# Patient Record
Sex: Female | Born: 1995 | Race: Black or African American | Hispanic: No | Marital: Single | Smoking: Never smoker
Health system: Southern US, Community
[De-identification: ages and names within clinical notes are randomized; demographics above are authoritative.]

---
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
## 2017-01-13 ENCOUNTER — Emergency Department (HOSPITAL_COMMUNITY): Payer: Medicaid Other

## 2017-01-13 ENCOUNTER — Encounter (HOSPITAL_COMMUNITY): Payer: Self-pay | Admitting: Emergency Medicine

## 2017-01-13 ENCOUNTER — Emergency Department (HOSPITAL_COMMUNITY)
Admission: EM | Admit: 2017-01-13 | Discharge: 2017-01-13 | Disposition: A | Discharge status: Home / Self Care | Payer: Medicaid Other | Attending: Emergency Medicine | Admitting: Emergency Medicine

## 2017-01-13 DIAGNOSIS — Y999 Unspecified external cause status: Secondary | ICD-10-CM | POA: Diagnosis not present

## 2017-01-13 DIAGNOSIS — Y929 Unspecified place or not applicable: Secondary | ICD-10-CM | POA: Insufficient documentation

## 2017-01-13 DIAGNOSIS — Y9302 Activity, running: Secondary | ICD-10-CM | POA: Diagnosis not present

## 2017-01-13 DIAGNOSIS — S99911A Unspecified injury of right ankle, initial encounter: Secondary | ICD-10-CM | POA: Diagnosis present

## 2017-01-13 DIAGNOSIS — X501XXA Overexertion from prolonged static or awkward postures, initial encounter: Secondary | ICD-10-CM | POA: Insufficient documentation

## 2017-01-13 DIAGNOSIS — Z79899 Other long term (current) drug therapy: Secondary | ICD-10-CM | POA: Diagnosis not present

## 2017-01-13 DIAGNOSIS — S93401A Sprain of unspecified ligament of right ankle, initial encounter: Secondary | ICD-10-CM | POA: Diagnosis not present

## 2017-01-13 MED ORDER — NAPROXEN 500 MG PO TABS: 500.0000 mg | ORAL_TABLET | Freq: Two times a day (BID) | ORAL | 0 refills | Status: DC

## 2017-01-13 MED ORDER — OXYCODONE-ACETAMINOPHEN 5-325 MG PO TABS
1.0000 | ORAL_TABLET | Freq: Once | ORAL | Status: AC
  Administered 2017-01-13: 1 via ORAL
  Filled 2017-01-13: qty 1

## 2017-01-13 MED ORDER — NAPROXEN 250 MG PO TABS
500.0000 mg | ORAL_TABLET | Freq: Once | ORAL | Status: AC
  Administered 2017-01-13: 500 mg via ORAL
  Filled 2017-01-13: qty 2

## 2017-01-13 NOTE — ED Triage Notes (Signed)
Pt reports she was running from a dog and twisted her right ankle.  She reports she is unable to apply any weight, and is slightly swollen.

## 2017-01-13 NOTE — ED Provider Notes (Signed)
[facility] Provider Note   CSN: [id number] Arrival date & time: 01/13/17  0022     History   Chief Complaint Chief Complaint  Patient presents with  . Ankle Pain    HPI Kristin [name] is a 21 y.o. female.  The history is provided by the patient. No language interpreter was used.  Ankle Pain   The incident occurred 3 to 5 hours ago. Injury mechanism: rolled ankle. The pain is present in the right ankle. The quality of the pain is described as aching and throbbing. The pain is moderate. The pain has been constant since onset. Associated symptoms include inability to bear weight (secondary to pain). Pertinent negatives include no muscle weakness and no loss of sensation. The symptoms are aggravated by bearing weight. She has tried rest for the symptoms. The treatment provided no relief.    History reviewed. No pertinent past medical history.  There are no active problems to display for this patient.   History reviewed. No pertinent surgical history.  OB History    No data available       Home Medications    Prior to Admission medications   Medication Sig Start Date End Date Taking? Authorizing Provider  cephALEXin (KEFLEX) 500 MG capsule Take 1 capsule (500 mg total) by mouth 2 (two) times daily. X 7 days 07/03/14   Piepenbrink, Victorino Dike, PA-C  metroNIDAZOLE (FLAGYL) 500 MG tablet Take 1 tablet (500 mg total) by mouth 2 (two) times daily. 04/09/14   Reuben Likes, MD  naproxen (NAPROSYN) 500 MG tablet Take 1 tablet (500 mg total) by mouth 2 (two) times daily. 01/13/17   Antony Madura, PA-C  ondansetron (ZOFRAN ODT) 4 MG disintegrating tablet Take 1 tablet (4 mg total) by mouth every 8 (eight) hours as needed for nausea or vomiting. 07/03/14   Piepenbrink, Victorino Dike, PA-C    Family History No family history on file.  Social History Social History  Substance Use Topics  . Smoking status: Never Smoker  . Smokeless tobacco: Not on file  . Alcohol use No      Allergies   Penicillins   Review of Systems Review of Systems Ten systems reviewed and are negative for acute change, except as noted in the HPI.    Physical Exam Updated Vital Signs BP 129/74 (BP Location: Right Arm)   Pulse 81   Temp 98.8 F (37.1 C) (Oral)   Resp 16   Ht 5\' 6"  (1.676 m)   LMP 01/10/2017 (Approximate)   SpO2 100%   Physical Exam  Constitutional: She is oriented to person, place, and time. She appears well-developed and well-nourished. No distress.  Nontoxic and in NAD  HENT:  Head: Normocephalic and atraumatic.  Eyes: Conjunctivae and EOM are normal. No scleral icterus.  Neck: Normal range of motion.  Cardiovascular: Normal rate, regular rhythm and intact distal pulses.   Pulmonary/Chest: Effort normal. No respiratory distress.  Respirations even and unlabored  Musculoskeletal: Normal range of motion. She exhibits tenderness.  Normal range of motion of the right ankle. There is tenderness to palpation to the lateral malleolus of the right ankle. No crepitus or deformity. No effusion.  Neurological: She is alert and oriented to person, place, and time. She exhibits normal muscle tone. Coordination normal.  Patient able to wiggle all toes  Skin: Skin is warm and dry. No rash noted. She is not diaphoretic. No erythema. No pallor.  Psychiatric: She has a normal mood and affect. Her behavior is normal.  Nursing  note and vitals reviewed.    ED Treatments / Results  Labs (all labs ordered are listed, but only abnormal results are displayed) Labs Reviewed - No data to display  EKG  EKG Interpretation None       Radiology Dg Ankle Complete Right  Result Date: 01/13/2017 CLINICAL DATA:  Patient fell tonight. Right lateral ankle pain and swelling. EXAM: RIGHT ANKLE - COMPLETE 3+ VIEW COMPARISON:  None. FINDINGS: There is no evidence of fracture, dislocation, or joint effusion. There is no evidence of arthropathy or other focal bone abnormality.  Soft tissues are unremarkable. IMPRESSION: Negative. Electronically Signed   By: Burman NievesWilliam  Stevens M.D.   On: 01/13/2017 01:10    Procedures Procedures (including critical care time)  Medications Ordered in ED Medications  oxyCODONE-acetaminophen (PERCOCET/ROXICET) 5-325 MG per tablet 1 tablet (1 tablet Oral Given 01/13/17 0143)  naproxen (NAPROSYN) tablet 500 mg (500 mg Oral Given 01/13/17 0143)     Initial Impression / Assessment and Plan / ED Course  I have reviewed the triage vital signs and the nursing notes.  Pertinent labs & imaging results that were available during my care of the patient were reviewed by me and considered in my medical decision making (see chart for details).     Patient presenting for ankle pain after rolling her ankle today. She is neurovascularly intact. Symptoms consistent with ankle sprain. X-ray negative for fracture. Supportive management advised. ASO ankle brace and crutches given. Return precautions discussed and provided. Patient discharged in stable condition with no unaddressed concerns.   Final Clinical Impressions(s) / ED Diagnoses   Final diagnoses:  Sprain of right ankle, unspecified ligament, initial encounter    New Prescriptions Discharge Medication List as of 01/13/2017  1:41 AM    START taking these medications   Details  naproxen (NAPROSYN) 500 MG tablet Take 1 tablet (500 mg total) by mouth 2 (two) times daily., Starting Wed 01/13/2017, Print         Antony MaduraHumes, Shawnette Augello, PA-C 01/13/17 0429    Ward, Layla MawKristen N, DO 01/13/17 [phone]

## 2017-01-13 NOTE — ED Notes (Signed)
Patient verbalized understanding of discharge instructions and denies any further needs or questions at this time. VS stable. Patient ambulatory with steady gait. RN escorted to ED entrance in wheelchair.   

## 2017-10-26 ENCOUNTER — Encounter (HOSPITAL_COMMUNITY): Payer: Self-pay | Admitting: Emergency Medicine

## 2017-10-26 ENCOUNTER — Ambulatory Visit (HOSPITAL_COMMUNITY)
Admission: EM | Admit: 2017-10-26 | Discharge: 2017-10-26 | Disposition: A | Discharge status: Home / Self Care | Payer: Medicaid Other | Attending: Family Medicine | Admitting: Family Medicine

## 2017-10-26 DIAGNOSIS — Z880 Allergy status to penicillin: Secondary | ICD-10-CM | POA: Insufficient documentation

## 2017-10-26 DIAGNOSIS — R1084 Generalized abdominal pain: Secondary | ICD-10-CM

## 2017-10-26 LAB — POCT PREGNANCY, URINE: PREG TEST UR: NEGATIVE

## 2017-10-26 LAB — POCT URINALYSIS DIP (DEVICE)
Bilirubin Urine: NEGATIVE
Glucose, UA: NEGATIVE mg/dL
HGB URINE DIPSTICK: NEGATIVE
Leukocytes, UA: NEGATIVE
NITRITE: NEGATIVE
PROTEIN: NEGATIVE mg/dL
Specific Gravity, Urine: 1.02 (ref 1.005–1.030)
UROBILINOGEN UA: 0.2 mg/dL (ref 0.0–1.0)
pH: 6 (ref 5.0–8.0)

## 2017-10-26 NOTE — Discharge Instructions (Signed)
Pregnancy test is negative, urine showed no signs of infection.  We will send urine off to check for STDs.  If you do not have a bowel movement in the next 2 days, please pick up Miralax and start a capful daily or you may get docusate and take twice daily, then decrease once having regular bowels.  Please return if abdominal pain worsening, you develop other symptoms related to your pain including fever, nausea, vomiting, diarrhea, urinary symptoms, vaginal symptoms, lightheadedness, dizziness.

## 2017-10-26 NOTE — ED Triage Notes (Signed)
PT reports lower abdominal pain that wraps around from lower back. PT reports no vaginal discharge, no pain with intercourse and no dysuria.   PT reports she once had a genital infection that made her back hurt.

## 2017-10-26 NOTE — ED Provider Notes (Signed)
[facility]    CSN: [id number] Arrival date & time: 10/26/17  1713     History   Chief Complaint Chief Complaint  Patient presents with  . Abdominal Pain    HPI Kristin [name] is a 22 y.o. female no significant past medical history presenting today with abdominal pain.  She states that she has had some cramping over the past week.  Pain is located in her lower abdomen and will wrap around to her back.  Cramping is constant although worsens at times.  Denies fever, nausea, vomiting.  Denies any changes in bowels, denies diarrhea, blood in stool.  Last bowel movement was proximally 2 days ago.  She states that this is normal for her to skip a day.  Last menstrual period was around February 14, patient is not on birth control.  She is sexually active.  Denies vaginal discharge, pelvic pain, vaginal irritation or odor.  Denies urinary symptoms of dysuria, increased frequency, incomplete voiding.  Denies any specific aggravating factors, denies associations with food, movement.  Patient is eating and drinking normally without issue or worsening pain.  HPI  History reviewed. No pertinent past medical history.  There are no active problems to display for this patient.   History reviewed. No pertinent surgical history.  OB History    No data available       Home Medications    Prior to Admission medications   Medication Sig Start Date End Date Taking? Authorizing Provider  cephALEXin (KEFLEX) 500 MG capsule Take 1 capsule (500 mg total) by mouth 2 (two) times daily. X 7 days 07/03/14   [name], Kristin DikeJennifer, PA-C  metroNIDAZOLE (FLAGYL) 500 MG tablet Take 1 tablet (500 mg total) by mouth 2 (two) times daily. 04/09/14   Kristin LikesKeller, Kristin C, MD  naproxen (NAPROSYN) 500 MG tablet Take 1 tablet (500 mg total) by mouth 2 (two) times daily. 01/13/17   Antony MaduraHumes, Kelly, PA-C  ondansetron (ZOFRAN ODT) 4 MG disintegrating tablet Take 1 tablet (4 mg total) by mouth every 8 (eight) hours as  needed for nausea or vomiting. 07/03/14   [name], Kristin DikeJennifer, PA-C    Family History No family history on file.  Social History Social History   Tobacco Use  . Smoking status: Never Smoker  Substance Use Topics  . Alcohol use: No  . Drug use: No     Allergies   Penicillins   Review of Systems Review of Systems  Constitutional: Negative for fever.  Respiratory: Negative for shortness of breath.   Cardiovascular: Negative for chest pain.  Gastrointestinal: Positive for abdominal pain. Negative for diarrhea, nausea and vomiting.  Genitourinary: Negative for dysuria, flank pain, genital sores, hematuria, menstrual problem, vaginal bleeding, vaginal discharge and vaginal pain.  Musculoskeletal: Positive for back pain.  Skin: Negative for rash.  Neurological: Negative for dizziness, light-headedness and headaches.     Physical Exam Triage Vital Signs ED Triage Vitals  Enc Vitals Group     BP 10/26/17 1746 121/82     Pulse Rate 10/26/17 1744 78     Resp 10/26/17 1744 16     Temp 10/26/17 1744 98.6 F (37 C)     Temp Source 10/26/17 1744 Oral     SpO2 10/26/17 1744 98 %     Weight 10/26/17 1742 244 lb (110.7 kg)     Height --      Head Circumference --      Peak Flow --      Pain Score 10/26/17 1742  8     Pain Loc --      Pain Edu? --      Excl. in GC? --    No data found.  Updated Vital Signs BP 121/82   Pulse 78   Temp 98.6 F (37 C) (Oral)   Resp 16   Wt 244 lb (110.7 kg)   LMP 10/07/2017   SpO2 98%   BMI 39.38 kg/m   Visual Acuity Right Eye Distance:   Left Eye Distance:   Bilateral Distance:    Right Eye Near:   Left Eye Near:    Bilateral Near:     Physical Exam  Constitutional: She appears well-developed and well-nourished. No distress.  HENT:  Head: Normocephalic and atraumatic.  Eyes: Conjunctivae are normal.  Neck: Neck supple.  Cardiovascular: Normal rate and regular rhythm.  No murmur heard. Pulmonary/Chest: Effort normal and  breath sounds normal. No respiratory distress.  Breathing comfortably at rest, CTA BL  Abdominal: Soft. There is tenderness.  Bowel sounds present throughout all 4 quadrants, mild tenderness to epigastrium that was not reproducible on repalpation.  Nontender to right upper quadrant, negative Murphy's, nontender to right lower quadrant or left lower quadrant.  Musculoskeletal: She exhibits no edema.  Neurological: She is alert.  Skin: Skin is warm and dry.  Psychiatric: She has a normal mood and affect.  Nursing note and vitals reviewed.    UC Treatments / Results  Labs (all labs ordered are listed, but only abnormal results are displayed) Labs Reviewed  URINE CYTOLOGY ANCILLARY ONLY    EKG  EKG Interpretation None       Radiology No results found.  Procedures Procedures (including critical care time)  Medications Ordered in UC Medications - No data to display   Initial Impression / Assessment and Plan / UC Course  I have reviewed the triage vital signs and the nursing notes.  Pertinent labs & imaging results that were available during my care of the patient were reviewed by me and considered in my medical decision making (see chart for details).     Pregnancy test negative, UA unremarkable.  Will send urine cytology to check for STDs.  Will continue to monitor as patient does not have any other associated symptoms or signs concerning for emergent evaluation..  Tylenol or ibuprofen to help with the pain.  Advised that she does not have a bowel movement in the next 2 days to try MiraLAX or docusate.  Advised to return if having worsening abdominal pain or developing any other associated symptoms.  Final Clinical Impressions(s) / UC Diagnoses   Final diagnoses:  Generalized abdominal pain    ED Discharge Orders    None       Controlled Substance Prescriptions Kristin [name] Controlled Substance Registry consulted? Not Applicable   Lew Dawes, [place] 10/26/17  1859

## 2017-10-27 LAB — URINE CYTOLOGY ANCILLARY ONLY
Chlamydia: NEGATIVE
Neisseria Gonorrhea: NEGATIVE
TRICH (WINDOWPATH): NEGATIVE

## 2017-10-28 LAB — URINE CYTOLOGY ANCILLARY ONLY: Candida vaginitis: NEGATIVE

## 2019-05-20 ENCOUNTER — Encounter (HOSPITAL_COMMUNITY): Payer: Self-pay | Admitting: Emergency Medicine

## 2019-05-20 ENCOUNTER — Emergency Department (HOSPITAL_COMMUNITY): Payer: Self-pay

## 2019-05-20 ENCOUNTER — Emergency Department (HOSPITAL_COMMUNITY)
Admission: EM | Admit: 2019-05-20 | Discharge: 2019-05-21 | Disposition: A | Discharge status: Home / Self Care | Payer: Self-pay | Attending: Emergency Medicine | Admitting: Emergency Medicine

## 2019-05-20 ENCOUNTER — Other Ambulatory Visit: Payer: Self-pay

## 2019-05-20 DIAGNOSIS — Z79899 Other long term (current) drug therapy: Secondary | ICD-10-CM | POA: Insufficient documentation

## 2019-05-20 DIAGNOSIS — B9689 Other specified bacterial agents as the cause of diseases classified elsewhere: Secondary | ICD-10-CM | POA: Insufficient documentation

## 2019-05-20 DIAGNOSIS — R52 Pain, unspecified: Secondary | ICD-10-CM

## 2019-05-20 DIAGNOSIS — N760 Acute vaginitis: Secondary | ICD-10-CM | POA: Insufficient documentation

## 2019-05-20 DIAGNOSIS — R102 Pelvic and perineal pain: Secondary | ICD-10-CM | POA: Insufficient documentation

## 2019-05-20 LAB — URINALYSIS, ROUTINE W REFLEX MICROSCOPIC
Bilirubin Urine: NEGATIVE
Glucose, UA: NEGATIVE mg/dL
Hgb urine dipstick: NEGATIVE
Ketones, ur: 5 mg/dL — AB
Nitrite: NEGATIVE
Protein, ur: NEGATIVE mg/dL
Specific Gravity, Urine: 1.019 (ref 1.005–1.030)
pH: 7 (ref 5.0–8.0)

## 2019-05-20 LAB — CBC WITH DIFFERENTIAL/PLATELET
Abs Immature Granulocytes: 0.02 10*3/uL (ref 0.00–0.07)
Basophils Absolute: 0 10*3/uL (ref 0.0–0.1)
Basophils Relative: 0 %
Eosinophils Absolute: 0.2 10*3/uL (ref 0.0–0.5)
Eosinophils Relative: 2 %
HCT: 37.3 % (ref 36.0–46.0)
Hemoglobin: 12 g/dL (ref 12.0–15.0)
Immature Granulocytes: 0 %
Lymphocytes Relative: 33 %
Lymphs Abs: 3.7 10*3/uL (ref 0.7–4.0)
MCH: 29.3 pg (ref 26.0–34.0)
MCHC: 32.2 g/dL (ref 30.0–36.0)
MCV: 91.2 fL (ref 80.0–100.0)
Monocytes Absolute: 0.9 10*3/uL (ref 0.1–1.0)
Monocytes Relative: 7 %
Neutro Abs: 6.6 10*3/uL (ref 1.7–7.7)
Neutrophils Relative %: 58 %
Platelets: 343 10*3/uL (ref 150–400)
RBC: 4.09 MIL/uL (ref 3.87–5.11)
RDW: 13.5 % (ref 11.5–15.5)
WBC: 11.5 10*3/uL — ABNORMAL HIGH (ref 4.0–10.5)
nRBC: 0 % (ref 0.0–0.2)

## 2019-05-20 LAB — COMPREHENSIVE METABOLIC PANEL
ALT: 16 U/L (ref 0–44)
AST: 19 U/L (ref 15–41)
Albumin: 4.1 g/dL (ref 3.5–5.0)
Alkaline Phosphatase: 53 U/L (ref 38–126)
Anion gap: 10 (ref 5–15)
BUN: 9 mg/dL (ref 6–20)
CO2: 18 mmol/L — ABNORMAL LOW (ref 22–32)
Calcium: 9 mg/dL (ref 8.9–10.3)
Chloride: 110 mmol/L (ref 98–111)
Creatinine, Ser: 0.59 mg/dL (ref 0.44–1.00)
GFR calc Af Amer: >60 mL/min (ref >60)
GFR calc non Af Amer: >60 mL/min (ref >60)
Glucose, Bld: 92 mg/dL (ref 70–99)
Potassium: 3.8 mmol/L (ref 3.5–5.1)
Sodium: 138 mmol/L (ref 135–145)
Total Bilirubin: 0.6 mg/dL (ref 0.3–1.2)
Total Protein: 7.4 g/dL (ref 6.5–8.1)

## 2019-05-20 LAB — WET PREP, GENITAL
Sperm: NONE SEEN
Trich, Wet Prep: NONE SEEN
Yeast Wet Prep HPF POC: NONE SEEN

## 2019-05-20 LAB — I-STAT BETA HCG BLOOD, ED (MC, WL, AP ONLY): I-stat hCG, quantitative: <5 m[IU]/mL (ref <5)

## 2019-05-20 NOTE — ED Triage Notes (Signed)
Pt c/o intermittent lower abdominal pelvic pain. Pt denies pain with urination. Denies vomiting, denies diarrhea.

## 2019-05-21 LAB — HIV ANTIBODY (ROUTINE TESTING W REFLEX): HIV Screen 4th Generation wRfx: NONREACTIVE

## 2019-05-21 MED ORDER — METRONIDAZOLE 500 MG PO TABS: 500.0000 mg | ORAL_TABLET | Freq: Two times a day (BID) | ORAL | 0 refills | Status: DC

## 2019-05-21 NOTE — Discharge Instructions (Signed)
Take antibiotics as directed, make sure you complete entire course, do not drink alcohol while taking this medication.  Continue to use Motrin and Tylenol as needed for pain.  Monitor your symptoms if you have persisting or worsening abdominal pain, fever, vomiting or any other new or concerning symptoms return for reevaluation.  You have STD testing pending and will be called by phone with any positive results, if positive please follow-up with your OB/GYN or at the health department for treatment and notify any partners so they can be tested and treated as well.

## 2019-05-21 NOTE — ED Provider Notes (Addendum)
[facility]-EMERGENCY DEPT Provider Note   CSN: [id number] Arrival date & time: 05/20/19  1500     History   Chief Complaint Chief Complaint  Patient presents with   Pelvic Pain    HPI Kristin [name] is a 23 y.o. female.     Kristin [name] is a 23 y.o. female who is otherwise healthy, presents to the emergency department for evaluation of 1 week of intermittent lower abdominal and pelvic pain.  Patient reports pain seems to come and go, currently is mild, does not localize to one side.  She denies any associated nausea or vomiting.  No abnormal bowel movements.  She denies dysuria or urinary frequency.  No flank pain.  She has noted some gray-white discharge.  Patient reports she is sexually active but does not always use protection, currently has one partner.  She has not taken any medications to treat the symptoms.  Reports she got tired of having the pain occur intermittently and so decided to come in, it did not get significantly worse today.  Denies any other associated symptoms.     History reviewed. No pertinent past medical history.  There are no active problems to display for this patient.   History reviewed. No pertinent surgical history.   OB History   No obstetric history on file.      Home Medications    Prior to Admission medications   Medication Sig Start Date End Date Taking? Authorizing Provider  ibuprofen (ADVIL) 200 MG tablet Take 800 mg by mouth every 6 (six) hours as needed for moderate pain.   Yes [provider]  cephALEXin (KEFLEX) 500 MG capsule Take 1 capsule (500 mg total) by mouth 2 (two) times daily. X 7 days Patient not taking: Reported on 05/20/2019 07/03/14   Piepenbrink, Victorino Dike, PA-C  metroNIDAZOLE (FLAGYL) 500 MG tablet Take 1 tablet (500 mg total) by mouth 2 (two) times daily. One po bid x 7 days 05/21/19   Dartha Lodge, PA-C  naproxen (NAPROSYN) 500 MG tablet Take 1 tablet (500 mg total) by mouth 2  (two) times daily. Patient not taking: Reported on 05/20/2019 01/13/17   Antony Madura, PA-C  ondansetron (ZOFRAN ODT) 4 MG disintegrating tablet Take 1 tablet (4 mg total) by mouth every 8 (eight) hours as needed for nausea or vomiting. Patient not taking: Reported on 05/20/2019 07/03/14   Francee Piccolo, PA-C    Family History No family history on file.  Social History Social History   Tobacco Use   Smoking status: Never Smoker   Smokeless tobacco: Never Used  Substance Use Topics   Alcohol use: No   Drug use: No     Allergies   Penicillins   Review of Systems Review of Systems  Constitutional: Negative for chills and fever.  HENT: Negative.   Respiratory: Negative for cough and shortness of breath.   Cardiovascular: Negative for chest pain.  Gastrointestinal: Positive for abdominal pain. Negative for diarrhea, nausea and vomiting.  Genitourinary: Positive for pelvic pain and vaginal discharge. Negative for dysuria, flank pain, frequency, vaginal bleeding and vaginal pain.  Musculoskeletal: Negative for arthralgias and myalgias.  Skin: Negative for color change and rash.  Neurological: Negative for dizziness, syncope and light-headedness.     Physical Exam Updated Vital Signs BP 128/77    Pulse 72    Temp 98.5 F (36.9 C) (Oral)    Resp 18    Ht 5\' 6"  (1.676 m)    Wt 113.4 kg  LMP 04/29/2019    SpO2 100%    BMI 40.35 kg/m   Physical Exam Vitals signs and nursing note reviewed.  Constitutional:      General: She is not in acute distress.    Appearance: Normal appearance. She is well-developed and normal weight. She is not ill-appearing or diaphoretic.  HENT:     Head: Normocephalic and atraumatic.     Mouth/Throat:     Mouth: Mucous membranes are moist.     Pharynx: Oropharynx is clear.  Eyes:     General:        Right eye: No discharge.        Left eye: No discharge.     Pupils: Pupils are equal, round, and reactive to light.  Neck:      Musculoskeletal: Neck supple.  Cardiovascular:     Rate and Rhythm: Normal rate and regular rhythm.     Heart sounds: Normal heart sounds. No murmur. No friction rub. No gallop.   Pulmonary:     Effort: Pulmonary effort is normal. No respiratory distress.     Breath sounds: Normal breath sounds. No wheezing or rales.     Comments: Respirations equal and unlabored, patient able to speak in full sentences, lungs clear to auscultation bilaterally Abdominal:     General: Bowel sounds are normal. There is no distension.     Palpations: Abdomen is soft. There is no mass.     Tenderness: There is abdominal tenderness. There is no guarding.     Comments: Abdomen soft, non-distended, bowel sounds present across the low abdomen into the pelvis, does not localize to one side, no guarding or peritoneal signs.  No CVA tenderness bilaterally.  Genitourinary:    Comments: Chaperone present during pelvic exam No external genital lesions noted Speculum exam reveals moderate amount of white-grey discharge Bimaual exam with some left adnexal tenderness without palpable masses, no CMT Musculoskeletal:        General: No deformity.  Skin:    General: Skin is warm and dry.     Capillary Refill: Capillary refill takes less than 2 seconds.  Neurological:     Mental Status: She is alert.     Coordination: Coordination normal.     Comments: Speech is clear, able to follow commands Moves extremities without ataxia, coordination intact  Psychiatric:        Mood and Affect: Mood normal.        Behavior: Behavior normal.      ED Treatments / Results  Labs (all labs ordered are listed, but only abnormal results are displayed) Labs Reviewed  WET PREP, GENITAL - Abnormal; Notable for the following components:      Result Value   Clue Cells Wet Prep HPF POC PRESENT (*)    WBC, Wet Prep HPF POC FEW (*)    All other components within normal limits  URINALYSIS, ROUTINE W REFLEX MICROSCOPIC - Abnormal; Notable  for the following components:   Ketones, ur 5 (*)    Leukocytes,Ua SMALL (*)    Bacteria, UA RARE (*)    All other components within normal limits  COMPREHENSIVE METABOLIC PANEL - Abnormal; Notable for the following components:   CO2 18 (*)    All other components within normal limits  CBC WITH DIFFERENTIAL/PLATELET - Abnormal; Notable for the following components:   WBC 11.5 (*)    All other components within normal limits  RPR  HIV ANTIBODY (ROUTINE TESTING W REFLEX)  I-STAT BETA HCG BLOOD, ED (  [facility], [facility], [facility] ONLY)  GC/CHLAMYDIA PROBE AMP ([place]) NOT AT [facility]    EKG None  Radiology [place] Art/ven Flow Abd Pelv Doppler  Result Date: 05/21/2019 CLINICAL DATA:  One week pelvic pain EXAM: TRANSABDOMINAL ULTRASOUND OF PELVIS DOPPLER ULTRASOUND OF OVARIES TECHNIQUE: Transabdominal ultrasound examination of the pelvis was performed including evaluation of the uterus, ovaries, adnexal regions, and pelvic cul-de-sac. Color and duplex Doppler ultrasound was utilized to evaluate blood flow to the ovaries. COMPARISON:  None. FINDINGS: Uterus Measurements: 8.1 x 3.4 x 2.8 cm = volume: 41 mL. No fibroids or other mass visualized. Endometrium Thickness: 9 mm, normal.  No focal abnormality visualized. Right ovary Measurements: 4.6 x 4.1 x 4.3 cm = volume: 42.4 mL. Normal appearance of the ovarian stroma. Hypoattenuating dominant follicle versus corpus luteum cyst measuring 2.5 x 2.2 x 2.1 cm. Left ovary Measurements: 4.8 x 3.4 x 4.1 cm = volume: 34.4 mL. Normal appearance/no adnexal mass. Pulsed Doppler evaluation demonstrates normal low-resistance arterial and venous waveforms in both ovaries. Other: Sonographer noted trace anechoic free fluid in the deep pelvis IMPRESSION: Unremarkable pelvic ultrasound and Doppler evaluation with dominant follicle in the right ovary. Electronically Signed   By: Kreg Shropshire M.D.   On: 05/21/2019 00:38    Procedures Procedures (including critical care  time)  Medications Ordered in ED Medications - No data to display   Initial Impression / Assessment and Plan / ED Course  I have reviewed the triage vital signs and the nursing notes.  Pertinent labs & imaging results that were available during my care of the patient were reviewed by me and considered in my medical decision making (see chart for details).  23 year old female presents with a week of intermittent lower abdominal and pelvic pain.  It is currently mild, not associated with nausea, vomiting.  She denies dysuria or urinary frequency, no flank pain.  She has noted some white-gray discharge, is sexually active and does not always use protection.  Vitals are normal and patient is well-appearing, on exam she has some mild tenderness across the lower abdomen and pelvic region.  Pelvic exam ordered amount of white-gray discharge, no cervical motion tenderness but she does have some left-sided adnexal tenderness.  STD testing collected, will also check basic labs, and pelvic ultrasound to look for any TOA, ovarian cyst or ovarian torsion, will also assess for fibroids.  Labs show mild leukocytosis of 11, no significant electrolyte derangements, and liver function.  Urinalysis without signs of infection, clue cells present and a few WBCs, STD testing pending.  Ultrasound shows a dominant follicle in the right ovary, no evidence of ovarian cysts, torsion, adnexal masses or fibroids.  Given dominant follicle present, patient may be experiencing mittelschmerz pain from her cycle.  On reevaluation she has minimal if any tenderness over the lower abdomen.  I think that CT imaging would be low yield at this point and I have low suspicion for diverticulitis, appendicitis or other acute intra-abdominal pathology.  Will be treated with Flagyl for BV, discussed precautions with this medication.  I have discussed strict return precautions with the patient and close follow-up with PCP and OB/GYN.  Patient is  in agreement and would like to be discharged home rather than proceeding with further imaging or work-up at this time.  Encourage Motrin and Tylenol, as well as warm compresses, patient will return for any new or worsening symptoms.  Discharged home in good condition.  Final Clinical Impressions(s) / ED Diagnoses   Final diagnoses:  Pelvic pain in female  BV (bacterial vaginosis)    ED Discharge Orders         Ordered    metroNIDAZOLE (FLAGYL) 500 MG tablet  2 times daily     05/21/19 0058           Jacqlyn Larsen, PA-C 05/21/19 1545    Jacqlyn Larsen, PA-C 05/30/19 1217    Drenda Freeze, MD 05/30/19 [phone]

## 2019-05-23 LAB — RPR: RPR Ser Ql: NONREACTIVE — AB

## 2019-05-23 LAB — CERVICOVAGINAL ANCILLARY ONLY
Chlamydia: POSITIVE — AB
Neisseria Gonorrhea: POSITIVE — AB

## 2020-05-26 IMAGING — US US TRANSVAGINAL NON-OB
1 series · 14 of 25 positions shown · non-contrast
Comparison: None.

CLINICAL DATA: One week pelvic pain

EXAM:
TRANSABDOMINAL ULTRASOUND OF PELVIS
DOPPLER ULTRASOUND OF OVARIES
TECHNIQUE: Transabdominal ultrasound examination of the pelvis was performed
including evaluation of the uterus, ovaries, adnexal regions, and
pelvic cul-de-sac.
Color and duplex Doppler ultrasound was utilized to evaluate blood
flow to the ovaries.

[Series 1: us transvaginal non-ob · 14 of 59 slices shown]
[im 1/59]
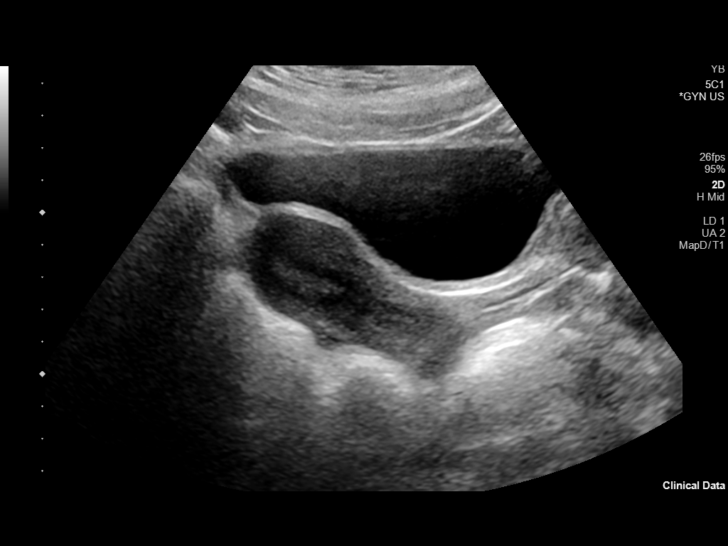
[im 5/59]
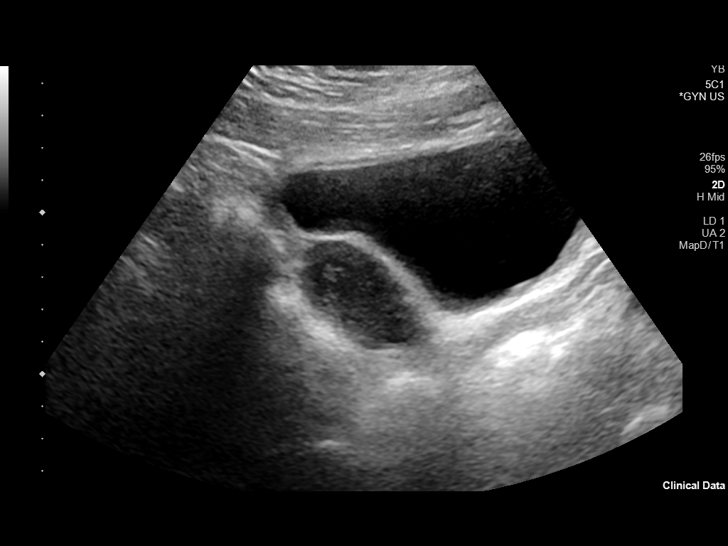
[im 10/59]
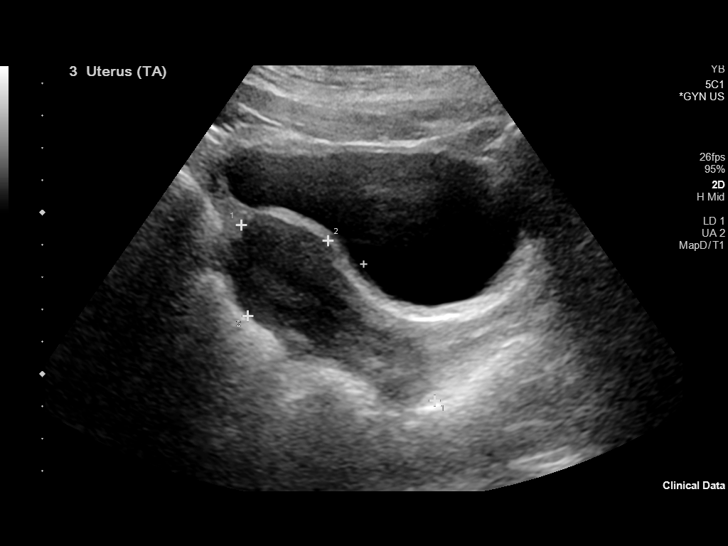
[im 15/59]
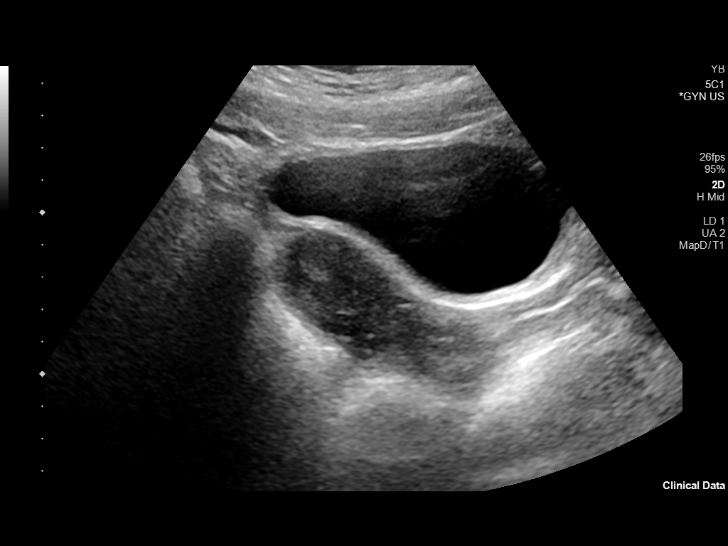
[im 20/59]
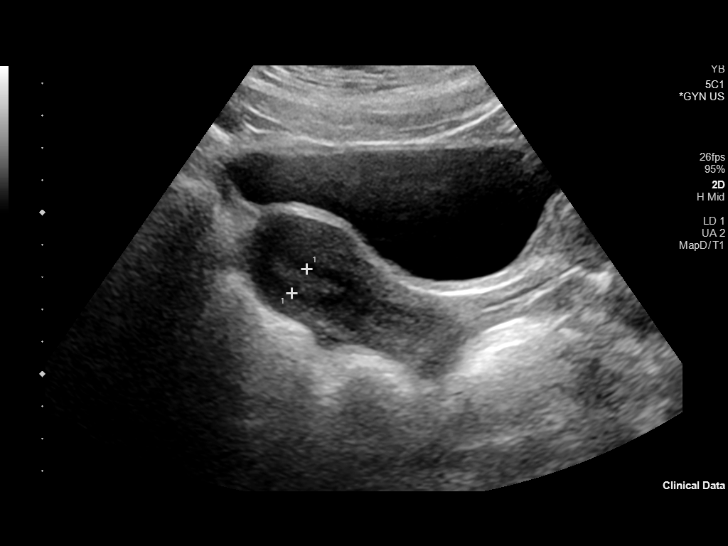
[im 22/59]
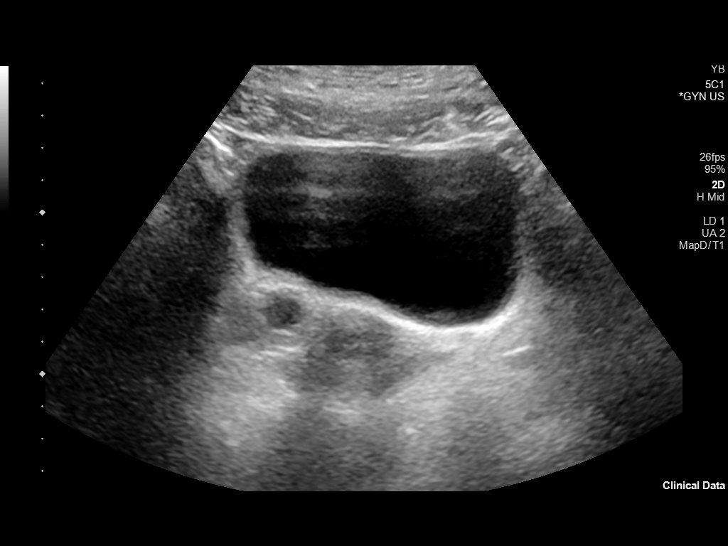
[im 27/59]
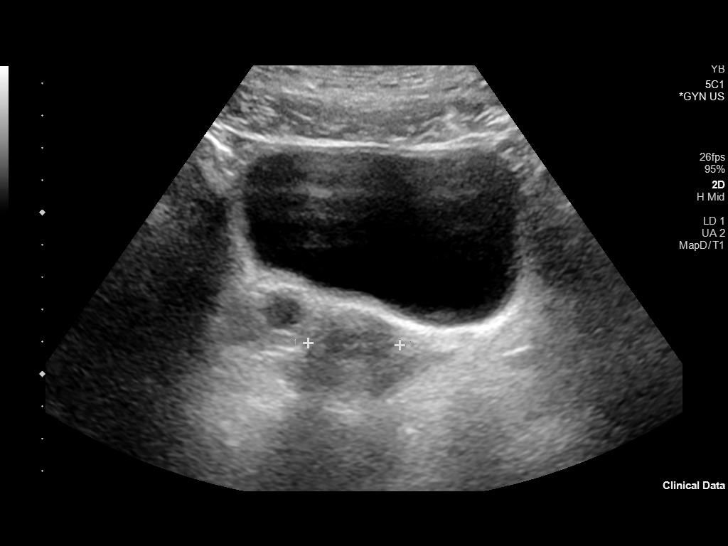
[im 32/59]
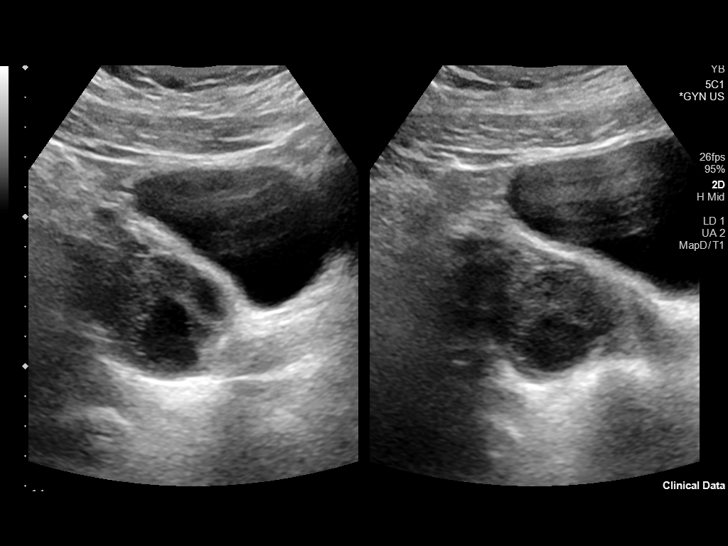
[im 37/59]
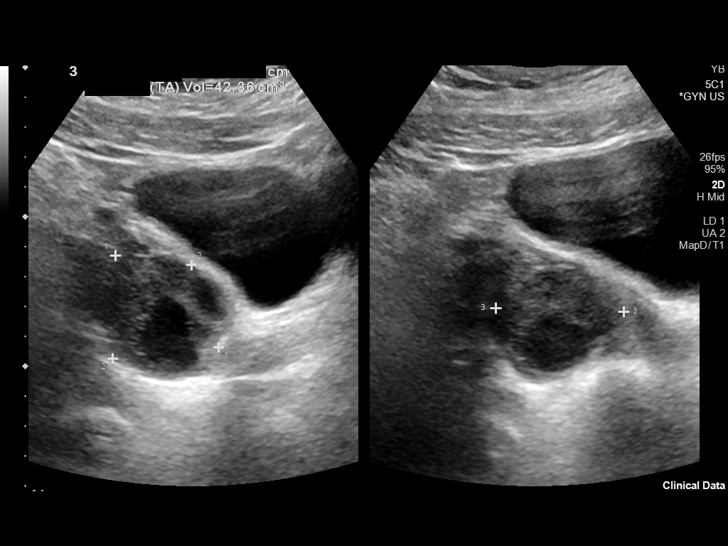
[im 39/59]
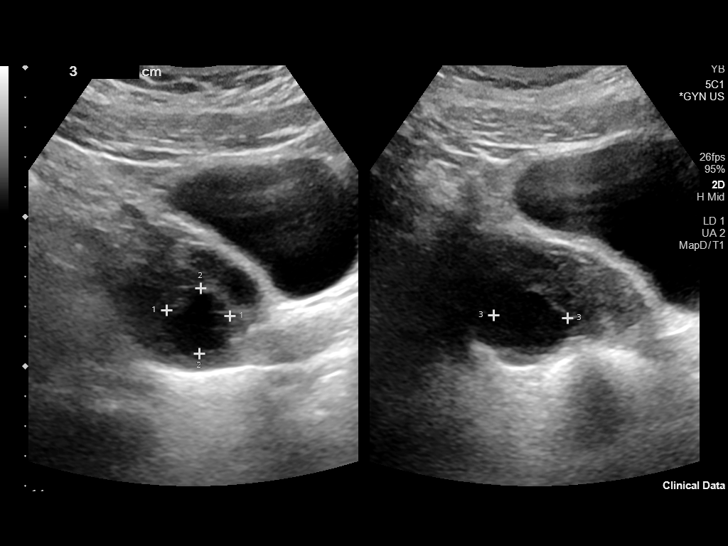
[im 44/59]
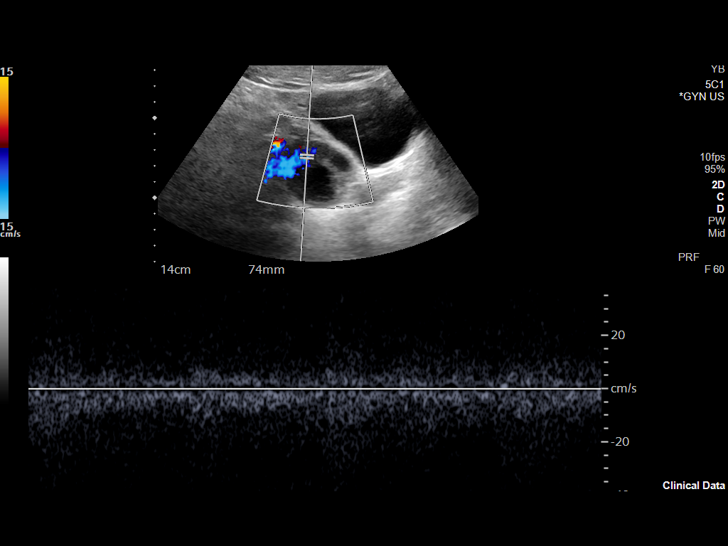
[im 49/59]
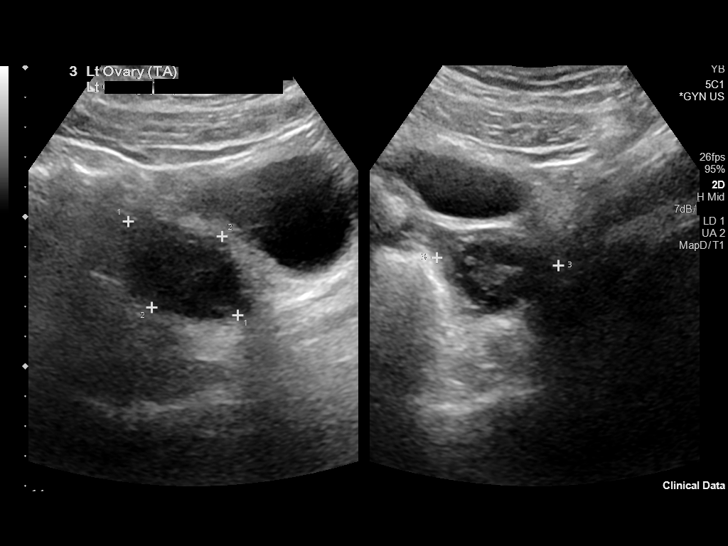
[im 54/59]
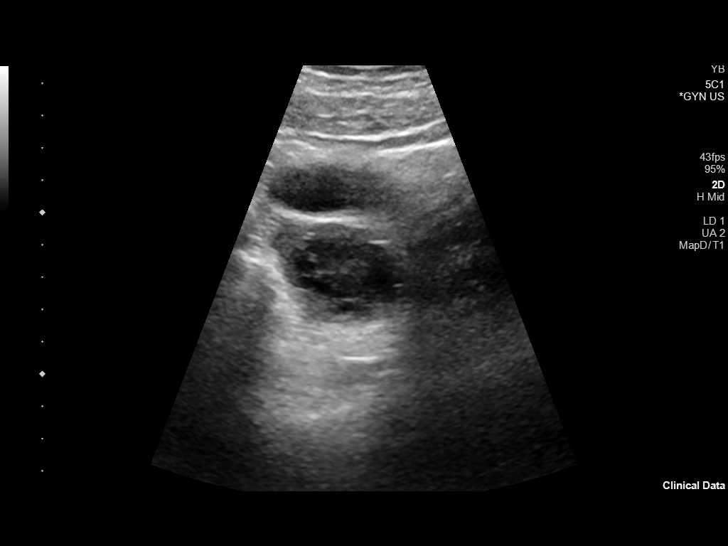
[im 59/59]
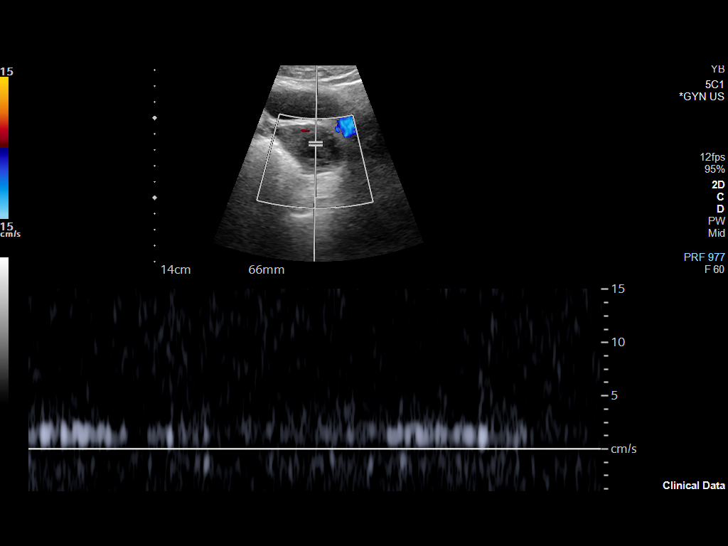

[14 of 25 positions shown; findings below may reference images not displayed]

FINDINGS: Uterus

Measurements: 8.1 x 3.4 x 2.8 cm = volume: 41 mL. No fibroids or
other mass visualized.

Endometrium

Thickness: 9 mm, normal.  No focal abnormality visualized.

Right ovary

Measurements: 4.6 x 4.1 x 4.3 cm = volume: 42.4 mL. Normal
appearance of the ovarian stroma. Hypoattenuating dominant follicle
versus corpus luteum cyst measuring 2.5 x 2.2 x 2.1 cm.

Left ovary

Measurements: 4.8 x 3.4 x 4.1 cm = volume: 34.4 mL. Normal
appearance/no adnexal mass.

Pulsed Doppler evaluation demonstrates normal low-resistance
arterial and venous waveforms in both ovaries.

Other: Sonographer noted trace anechoic free fluid in the deep
pelvis
IMPRESSION: Unremarkable pelvic ultrasound and Doppler evaluation with dominant
follicle in the right ovary.

## 2020-05-26 IMAGING — US US ART/VEN ABD/PELV/SCROTUM DOPPLER LTD
1 series · 14 of 25 positions shown · non-contrast
Comparison: None.

CLINICAL DATA: One week pelvic pain

EXAM:
TRANSABDOMINAL ULTRASOUND OF PELVIS
DOPPLER ULTRASOUND OF OVARIES
TECHNIQUE: Transabdominal ultrasound examination of the pelvis was performed
including evaluation of the uterus, ovaries, adnexal regions, and
pelvic cul-de-sac.
Color and duplex Doppler ultrasound was utilized to evaluate blood
flow to the ovaries.

[Series 1: us art/ven abd/pelv/scrotum doppler ltd · 14 of 59 slices shown]
[im 1/59]
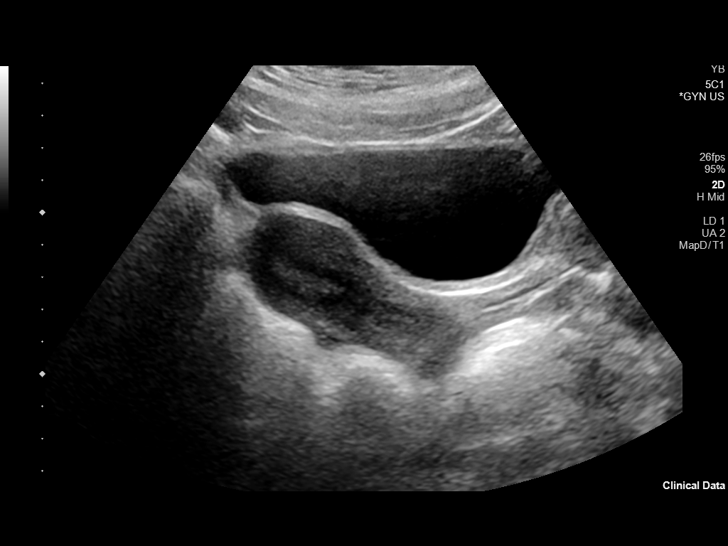
[im 5/59]
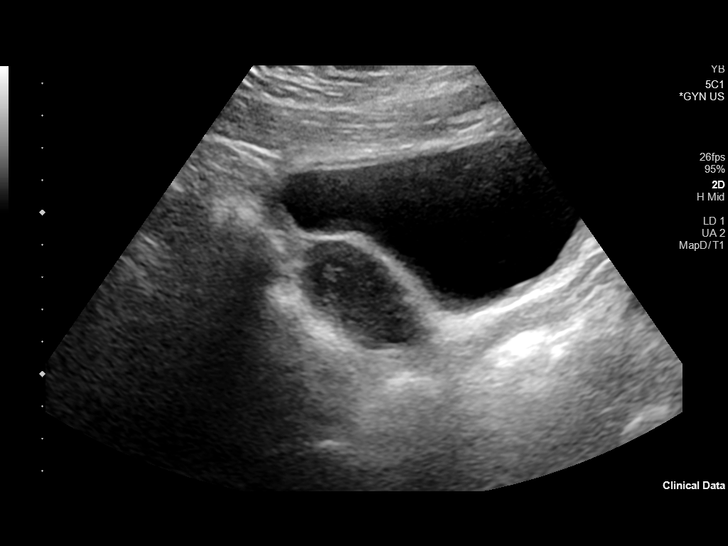
[im 10/59]
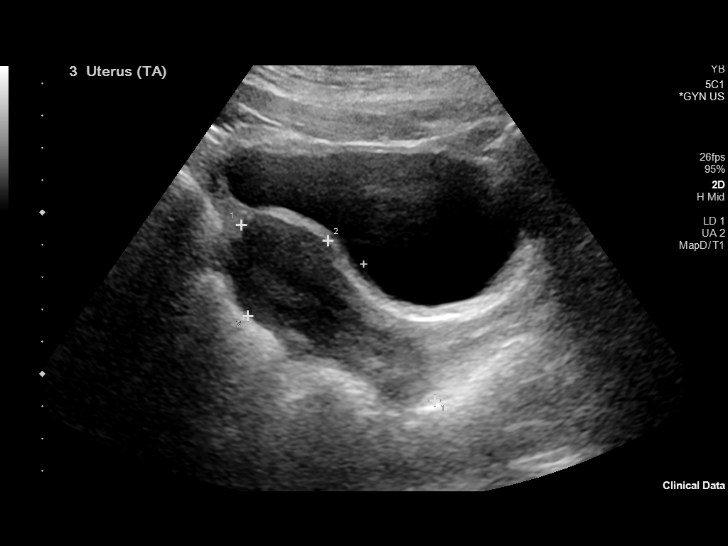
[im 15/59]
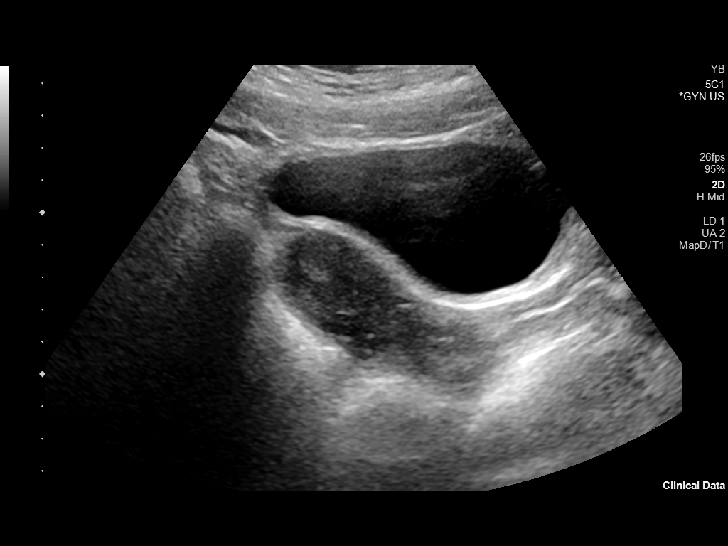
[im 20/59]
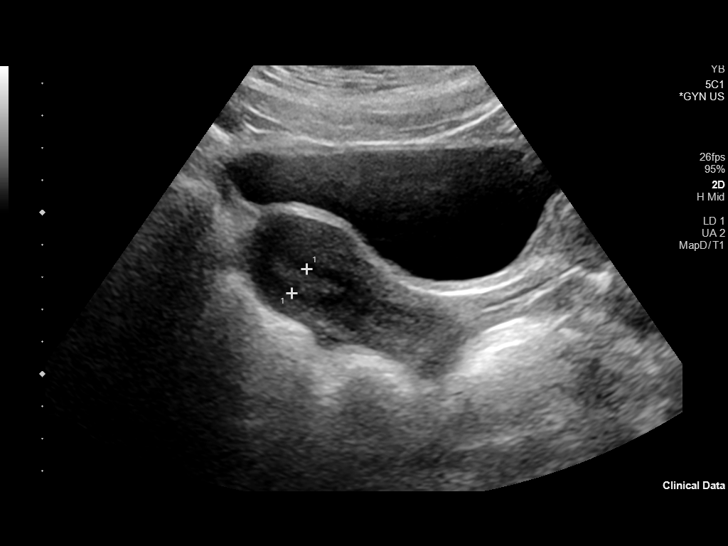
[im 22/59]
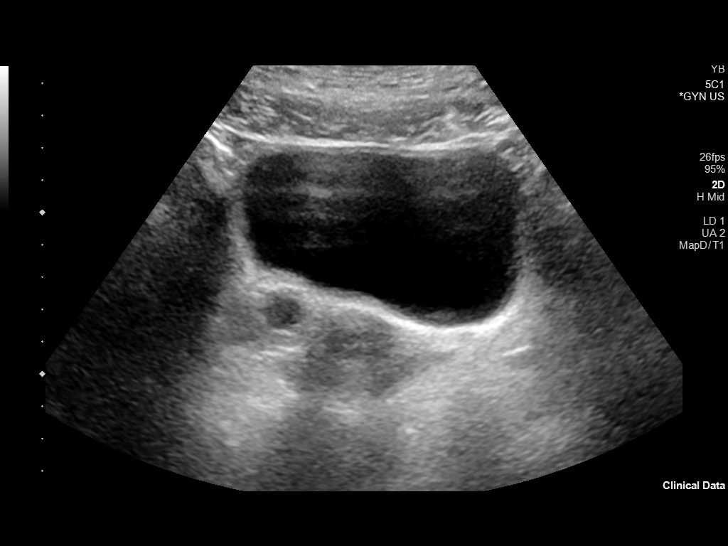
[im 27/59]
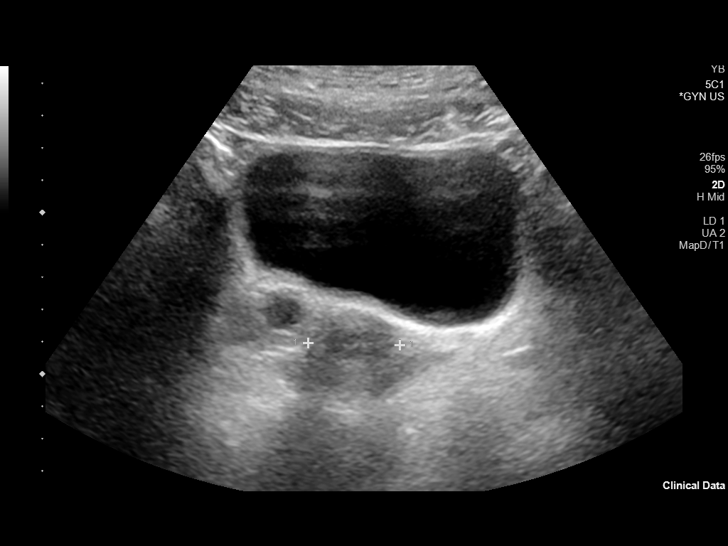
[im 32/59]
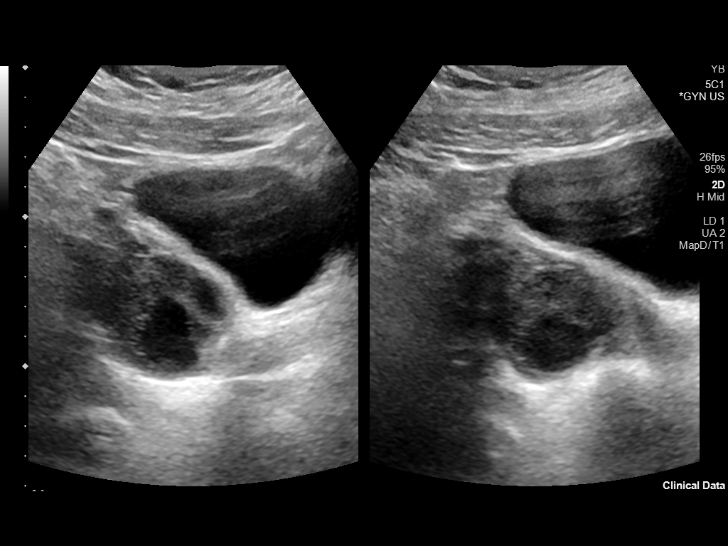
[im 37/59]
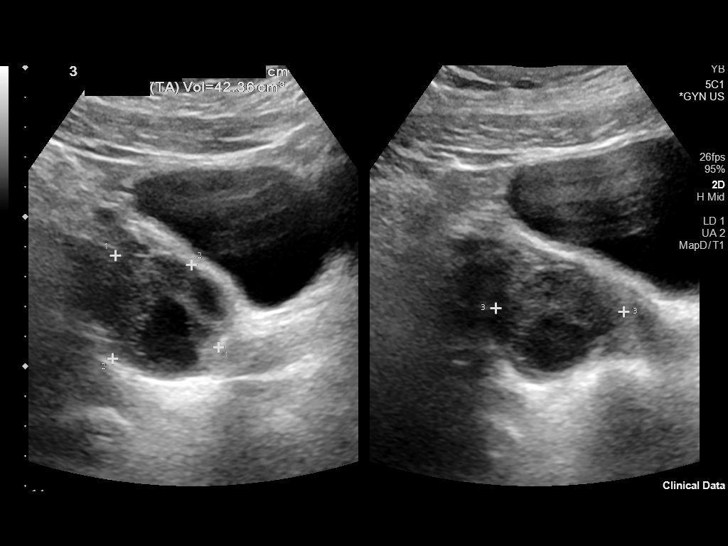
[im 39/59]
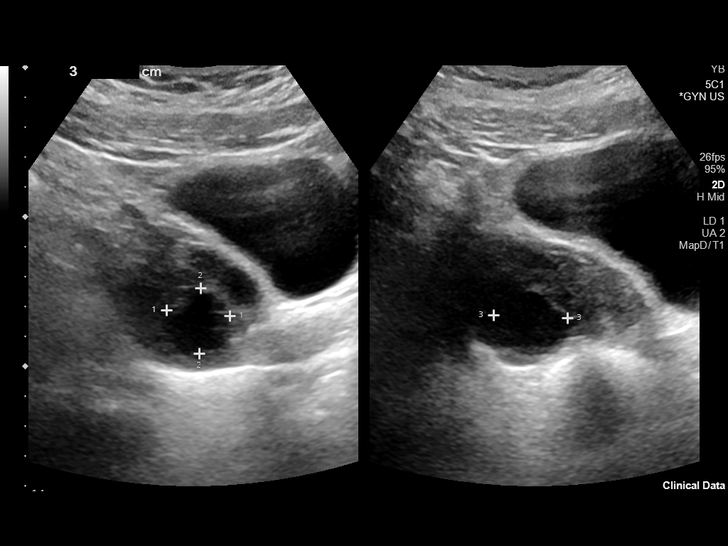
[im 44/59]
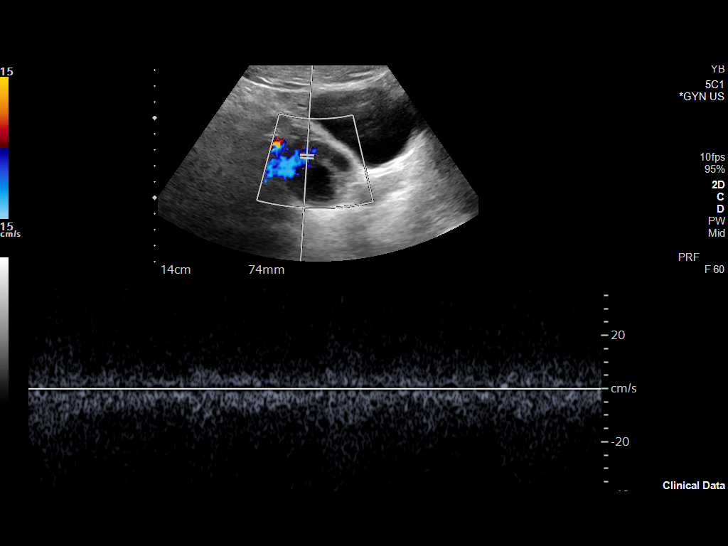
[im 49/59]
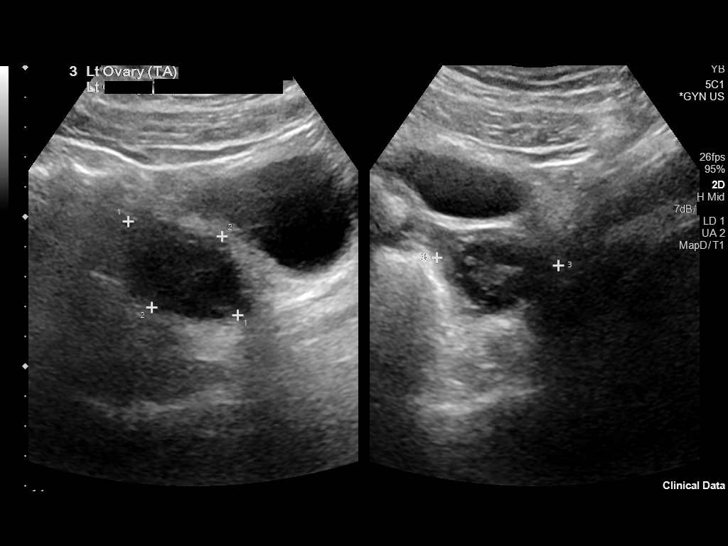
[im 54/59]
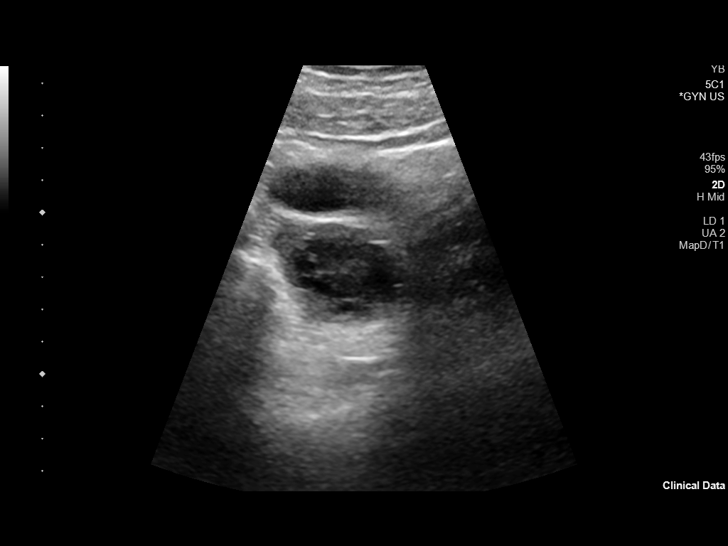
[im 59/59]
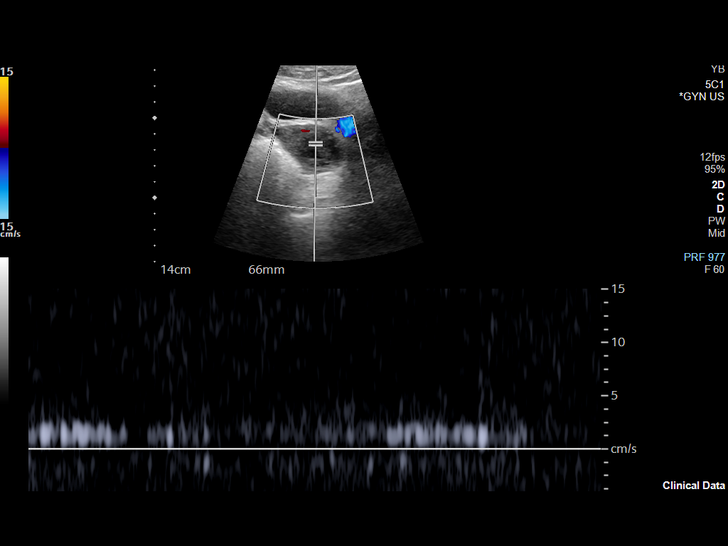

[14 of 25 positions shown; findings below may reference images not displayed]

FINDINGS: Uterus

Measurements: 8.1 x 3.4 x 2.8 cm = volume: 41 mL. No fibroids or
other mass visualized.

Endometrium

Thickness: 9 mm, normal.  No focal abnormality visualized.

Right ovary

Measurements: 4.6 x 4.1 x 4.3 cm = volume: 42.4 mL. Normal
appearance of the ovarian stroma. Hypoattenuating dominant follicle
versus corpus luteum cyst measuring 2.5 x 2.2 x 2.1 cm.

Left ovary

Measurements: 4.8 x 3.4 x 4.1 cm = volume: 34.4 mL. Normal
appearance/no adnexal mass.

Pulsed Doppler evaluation demonstrates normal low-resistance
arterial and venous waveforms in both ovaries.

Other: Sonographer noted trace anechoic free fluid in the deep
pelvis
IMPRESSION: Unremarkable pelvic ultrasound and Doppler evaluation with dominant
follicle in the right ovary.

## 2021-02-16 ENCOUNTER — Other Ambulatory Visit: Payer: Self-pay

## 2021-02-16 ENCOUNTER — Ambulatory Visit (HOSPITAL_COMMUNITY): Admission: EM | Admit: 2021-02-16 | Discharge: 2021-02-16 | Discharge status: Against Medical Advice | Payer: Self-pay

## 2021-02-17 ENCOUNTER — Emergency Department (HOSPITAL_COMMUNITY)
Admission: EM | Admit: 2021-02-17 | Discharge: 2021-02-17 | Disposition: A | Discharge status: Home / Self Care | Payer: Self-pay | Attending: Emergency Medicine | Admitting: Emergency Medicine

## 2021-02-17 ENCOUNTER — Encounter (HOSPITAL_COMMUNITY): Payer: Self-pay

## 2021-02-17 ENCOUNTER — Other Ambulatory Visit: Payer: Self-pay

## 2021-02-17 DIAGNOSIS — R109 Unspecified abdominal pain: Secondary | ICD-10-CM | POA: Insufficient documentation

## 2021-02-17 DIAGNOSIS — R110 Nausea: Secondary | ICD-10-CM | POA: Insufficient documentation

## 2021-02-17 DIAGNOSIS — N644 Mastodynia: Secondary | ICD-10-CM | POA: Insufficient documentation

## 2021-02-17 LAB — COMPREHENSIVE METABOLIC PANEL
ALT: 14 U/L (ref 0–44)
AST: 14 U/L — ABNORMAL LOW (ref 15–41)
Albumin: 3.7 g/dL (ref 3.5–5.0)
Alkaline Phosphatase: 48 U/L (ref 38–126)
Anion gap: 6 (ref 5–15)
BUN: 10 mg/dL (ref 6–20)
CO2: 22 mmol/L (ref 22–32)
Calcium: 8.9 mg/dL (ref 8.9–10.3)
Chloride: 110 mmol/L (ref 98–111)
Creatinine, Ser: 0.64 mg/dL (ref 0.44–1.00)
GFR, Estimated: >60 mL/min (ref >60)
Glucose, Bld: 93 mg/dL (ref 70–99)
Potassium: 3.9 mmol/L (ref 3.5–5.1)
Sodium: 138 mmol/L (ref 135–145)
Total Bilirubin: 0.6 mg/dL (ref 0.3–1.2)
Total Protein: 6.9 g/dL (ref 6.5–8.1)

## 2021-02-17 LAB — URINALYSIS, ROUTINE W REFLEX MICROSCOPIC
Bilirubin Urine: NEGATIVE
Glucose, UA: NEGATIVE mg/dL
Hgb urine dipstick: NEGATIVE
Ketones, ur: NEGATIVE mg/dL
Leukocytes,Ua: NEGATIVE
Nitrite: NEGATIVE
Protein, ur: NEGATIVE mg/dL
Specific Gravity, Urine: 1.015 (ref 1.005–1.030)
pH: 6 (ref 5.0–8.0)

## 2021-02-17 LAB — CBC WITH DIFFERENTIAL/PLATELET
Abs Immature Granulocytes: 0.02 10*3/uL (ref 0.00–0.07)
Basophils Absolute: 0 10*3/uL (ref 0.0–0.1)
Basophils Relative: 0 %
Eosinophils Absolute: 0.2 10*3/uL (ref 0.0–0.5)
Eosinophils Relative: 2 %
HCT: 37.8 % (ref 36.0–46.0)
Hemoglobin: 12.3 g/dL (ref 12.0–15.0)
Immature Granulocytes: 0 %
Lymphocytes Relative: 40 %
Lymphs Abs: 3.8 10*3/uL (ref 0.7–4.0)
MCH: 29.5 pg (ref 26.0–34.0)
MCHC: 32.5 g/dL (ref 30.0–36.0)
MCV: 90.6 fL (ref 80.0–100.0)
Monocytes Absolute: 0.7 10*3/uL (ref 0.1–1.0)
Monocytes Relative: 7 %
Neutro Abs: 4.8 10*3/uL (ref 1.7–7.7)
Neutrophils Relative %: 51 %
Platelets: 355 10*3/uL (ref 150–400)
RBC: 4.17 MIL/uL (ref 3.87–5.11)
RDW: 14.4 % (ref 11.5–15.5)
WBC: 9.5 10*3/uL (ref 4.0–10.5)
nRBC: 0 % (ref 0.0–0.2)

## 2021-02-17 LAB — I-STAT BETA HCG BLOOD, ED (MC, WL, AP ONLY): I-stat hCG, quantitative: <5 m[IU]/mL (ref <5)

## 2021-02-17 LAB — LIPASE, BLOOD: Lipase: 23 U/L (ref 11–51)

## 2021-02-17 MED ORDER — ONDANSETRON 4 MG PO TBDP: 4.0000 mg | ORAL_TABLET | Freq: Three times a day (TID) | ORAL | 0 refills | Status: DC | PRN

## 2021-02-17 MED ORDER — ONDANSETRON 4 MG PO TBDP
4.0000 mg | ORAL_TABLET | Freq: Once | ORAL | Status: AC
  Administered 2021-02-17: 4 mg via ORAL
  Filled 2021-02-17: qty 1

## 2021-02-17 NOTE — ED Provider Notes (Signed)
[facility]-EMERGENCY DEPT Provider Note   CSN: [id number] Arrival date & time: 02/17/21  0855     History Chief Complaint  Patient presents with   Abdominal Pain    Kristin [name] is a 25 y.o. female who reports no significant past medical history presenting to ED with abdominal pain and nausea.  She reports symptom onset approximately 2 weeks ago.  She reports bilateral breast tenderness.  She says she has had a poor appetite, sometimes nauseated after eating.  She denies diarrhea, dysuria, hematuria, fevers or chills.  She denies cough, congestion, shortness of breath.  She denies any history of abdominal surgery.  She reports her last menstrual period was at the start of the month from June 5 to June 7.  She gets it regularly every month.  She has never been pregnant before.  HPI     History reviewed. No pertinent past medical history.  There are no problems to display for this patient.   History reviewed. No pertinent surgical history.   OB History   No obstetric history on file.     Family History  Problem Relation Age of Onset   Healthy Mother    Healthy Father     Social History   Tobacco Use   Smoking status: Never   Smokeless tobacco: Never  Vaping Use   Vaping Use: Never used  Substance Use Topics   Alcohol use: No   Drug use: No    Home Medications Prior to Admission medications   Medication Sig Start Date End Date Taking? Authorizing Provider  ibuprofen (ADVIL) 200 MG tablet Take 800 mg by mouth every 6 (six) hours as needed for moderate pain.   Yes [provider]  ondansetron (ZOFRAN ODT) 4 MG disintegrating tablet Take 1 tablet (4 mg total) by mouth every 8 (eight) hours as needed for up to 15 doses for nausea or vomiting. 02/17/21  Yes Mkayla Steele, Kermit Balo, MD  cephALEXin (KEFLEX) 500 MG capsule Take 1 capsule (500 mg total) by mouth 2 (two) times daily. X 7 days Patient not taking: No sig reported 07/03/14    Piepenbrink, Jennifer, PA-C  metroNIDAZOLE (FLAGYL) 500 MG tablet Take 1 tablet (500 mg total) by mouth 2 (two) times daily. One po bid x 7 days Patient not taking: Reported on 02/17/2021 05/21/19   Dartha Lodge, PA-C  naproxen (NAPROSYN) 500 MG tablet Take 1 tablet (500 mg total) by mouth 2 (two) times daily. Patient not taking: No sig reported 01/13/17   Antony Madura, PA-C  ondansetron (ZOFRAN ODT) 4 MG disintegrating tablet Take 1 tablet (4 mg total) by mouth every 8 (eight) hours as needed for nausea or vomiting. Patient not taking: No sig reported 07/03/14   Piepenbrink, Victorino Dike, PA-C    Allergies    Penicillins  Review of Systems   Review of Systems  Constitutional:  Negative for chills and fever.  HENT:  Negative for ear pain and sore throat.   Eyes:  Negative for pain and visual disturbance.  Respiratory:  Negative for cough and shortness of breath.   Cardiovascular:  Negative for chest pain and palpitations.  Gastrointestinal:  Positive for abdominal pain and nausea. Negative for vomiting.  Genitourinary:  Negative for dysuria and hematuria.  Musculoskeletal:  Negative for arthralgias and back pain.  Skin:  Negative for color change and rash.  Neurological:  Negative for syncope and headaches.  All other systems reviewed and are negative.  Physical Exam Updated Vital Signs BP  119/74   Pulse 63   Temp 98.5 F (36.9 C) (Oral)   Resp 16   Ht 5\' 6"  (1.676 m)   Wt 128.7 kg   LMP 01/28/2021 (Approximate)   SpO2 100%   BMI 45.81 kg/m   Physical Exam Constitutional:      General: She is not in acute distress. HENT:     Head: Normocephalic and atraumatic.  Eyes:     Conjunctiva/sclera: Conjunctivae normal.     Pupils: Pupils are equal, round, and reactive to light.  Cardiovascular:     Rate and Rhythm: Normal rate and regular rhythm.  Pulmonary:     Effort: Pulmonary effort is normal. No respiratory distress.  Abdominal:     General: There is no distension.      Tenderness: There is no abdominal tenderness.  Skin:    General: Skin is warm and dry.  Neurological:     General: No focal deficit present.     Mental Status: She is alert. Mental status is at baseline.  Psychiatric:        Mood and Affect: Mood normal.        Behavior: Behavior normal.    ED Results / Procedures / Treatments   Labs (all labs ordered are listed, but only abnormal results are displayed) Labs Reviewed  COMPREHENSIVE METABOLIC PANEL - Abnormal; Notable for the following components:      Result Value   AST 14 (*)    All other components within normal limits  LIPASE, BLOOD  URINALYSIS, ROUTINE W REFLEX MICROSCOPIC  CBC WITH DIFFERENTIAL/PLATELET  I-STAT BETA HCG BLOOD, ED ([facility], [facility], [facility] ONLY)    EKG None  Radiology No results found.  Procedures Procedures   Medications Ordered in ED Medications  ondansetron (ZOFRAN-ODT) disintegrating tablet 4 mg (4 mg Oral Given 02/17/21 1126)    ED Course  I have reviewed the triage vital signs and the nursing notes.  Pertinent labs & imaging results that were available during my care of the patient were reviewed by me and considered in my medical decision making (see chart for details).  This patient presents to the Emergency Department with complaint of abdominal pain, nausea. This involves an extensive number of treatment options, and is a complaint that carries with it a high risk of complications and morbidity.  The differential diagnosis includes, but is not limited to, gastritis vs peptic ulcer vs constipation vs colitis vs UTI vs  pregnancy vs other  I ordered, reviewed, and interpreted labs, which were unremarkable. I ordered medication zofran or abdominal pain and/or nausea  After the interventions stated above, I reevaluated the patient and found that they remained clinically stable.  She had minimal symptoms, no abd ttp on exam.    Based on the patient's clinical exam, vital signs, risk factors, and ED  testing, I felt that the patient's overall risk of life-threatening emergency such as bowel perforation, avute biliary disease, surgical emergency, or sepsis was quite low. , but explained to the patient that this evaluation was not a definitive diagnostic workup.  I discussed outpatient follow up with primary care provider, and provided specialist office number on the patient's discharge paper if a referral was deemed necessary.  I discussed return precautions with the patient. I felt the patient was clinically stable for discharge.   Clinical Course as of 02/17/21 1858  Mon Feb 17, 2021  1023 I-stat hCG, quantitative: <5.0 [MT]  1109 Discussed work-up with the patient.  She has no abdominal  tenderness on reassessment.  Her labs are normal.  No leukocytosis or evidence of biliary disease per her clinical exam or per her lab work.  She prefers to go home at this time, we discussed return precautions for abdominal pain and nausea/vomiting, she verbalized understanding. [MT]    Clinical Course User Index [MT] Terald Sleeper, MD    Final Clinical Impression(s) / ED Diagnoses Final diagnoses:  Nausea  Abdominal pain, unspecified abdominal location    Rx / DC Orders ED Discharge Orders          Ordered    ondansetron (ZOFRAN ODT) 4 MG disintegrating tablet  Every 8 hours PRN        02/17/21 1109             Terald Sleeper, MD 02/17/21 1859

## 2021-02-17 NOTE — Discharge Instructions (Addendum)
Abdominal (belly) pain can be caused by many conditions.  Many cases can be observed and treated at home after an initial evaluation in the Emergency Department.    Today, your caregiver performed an examination and possibly ordered blood/urine tests and imaging (for example, CT scan, x-rays, ultrasound). Your caregiver felt that you were stable enough for discharge home, and that your current risk for a life-threatening condition was low.  However, please remember that many serious problems like appendicitis and gallbladder attacks can start out as nonspecific pain.  If any of the conditions below develop, please do not hesitate to return to the ER.  Please call your primary care provider's office today to schedule a follow up appointment in 1-2 days.  Follow up evaluations are very important.  You will need a repeat abdominal exam if your pain does not resolve, or if it returns, or worsens.    SEEK IMMEDIATE MEDICAL ATTENTION - INCLUDING RETURNING TO THE ER - IF:  The pain does not go away or becomes severe.   A temperature above 101F develops.   Repeated vomiting occurs (multiple episodes).   The pain becomes localized to portions of the abdomen. The right lower side could possibly be appendicitis. In an adult, the left lower portion of the abdomen could be colitis or diverticulitis.   Blood is being passed in stools or vomit (bright red or black tarry stools).   Return also if you develop chest pain, difficulty breathing, dizziness or fainting, or become confused, poorly responsive, or inconsolable (young children).

## 2021-02-17 NOTE — ED Triage Notes (Signed)
Patient c/o breast tenderness, nausea and abdominal discomfort x 2 weeks.

## 2022-03-12 ENCOUNTER — Ambulatory Visit (HOSPITAL_COMMUNITY)
Admission: EM | Admit: 2022-03-12 | Discharge: 2022-03-12 | Disposition: A | Discharge status: Home / Self Care | Payer: Self-pay | Attending: Student | Admitting: Student

## 2022-03-12 ENCOUNTER — Encounter (HOSPITAL_COMMUNITY): Payer: Self-pay

## 2022-03-12 DIAGNOSIS — Z880 Allergy status to penicillin: Secondary | ICD-10-CM

## 2022-03-12 DIAGNOSIS — K047 Periapical abscess without sinus: Secondary | ICD-10-CM

## 2022-03-12 MED ORDER — CLINDAMYCIN HCL 300 MG PO CAPS: 300.0000 mg | ORAL_CAPSULE | Freq: Four times a day (QID) | ORAL | 0 refills | Status: AC

## 2022-03-12 NOTE — ED Provider Notes (Signed)
[facility]    CSN: [id number] Arrival date & time: 03/12/22  1813      History   Chief Complaint Chief Complaint  Patient presents with   Dental Pain    HPI Kristin [name] is a 26 y.o. female presenting with right lower dental pain for 3 days.  History noncontributory.  States she has not been to a dentist in over 10 years.  She describes swelling over the area of the right lower wisdom tooth.  Denies foul taste in mouth, pain under tongue, pain under jaw, sore throat, trouble swallowing, fevers.  Has not attempted anything for intervention at home.  HPI  History reviewed. No pertinent past medical history.  There are no problems to display for this patient.   History reviewed. No pertinent surgical history.  OB History   No obstetric history on file.      Home Medications    Prior to Admission medications   Medication Sig Start Date End Date Taking? Authorizing Provider  clindamycin (CLEOCIN) 300 MG capsule Take 1 capsule (300 mg total) by mouth every 6 (six) hours for 7 days. 03/12/22 03/19/22 Yes Rhys Martini, PA-C  ibuprofen (ADVIL) 200 MG tablet Take 800 mg by mouth every 6 (six) hours as needed for moderate pain.    [provider]    Family History Family History  Problem Relation Age of Onset   Healthy Mother    Healthy Father     Social History Social History   Tobacco Use   Smoking status: Never   Smokeless tobacco: Never  Vaping Use   Vaping Use: Never used  Substance Use Topics   Alcohol use: No   Drug use: No     Allergies   Penicillins   Review of Systems Review of Systems  HENT:  Positive for dental problem.   All other systems reviewed and are negative.    Physical Exam Triage Vital Signs ED Triage Vitals  Enc Vitals Group     BP 03/12/22 1825 103/75     Pulse Rate 03/12/22 1825 83     Resp 03/12/22 1825 18     Temp 03/12/22 1825 98.4 F (36.9 C)     Temp src --      SpO2 03/12/22 1825 98 %      Weight --      Height --      Head Circumference --      Peak Flow --      Pain Score 03/12/22 1824 10     Pain Loc --      Pain Edu? --      Excl. in GC? --    No data found.  Updated Vital Signs BP 103/75   Pulse 83   Temp 98.4 F (36.9 C)   Resp 18   LMP 03/06/2022   SpO2 98%   Visual Acuity Right Eye Distance:   Left Eye Distance:   Bilateral Distance:    Right Eye Near:   Left Eye Near:    Bilateral Near:     Physical Exam Vitals reviewed.  Constitutional:      General: She is not in acute distress.    Appearance: Normal appearance. She is not ill-appearing, toxic-appearing or diaphoretic.  HENT:     Head: Normocephalic and atraumatic.     Jaw: There is normal jaw occlusion. No trismus, tenderness, swelling, pain on movement or malocclusion.     Salivary Glands: Right salivary gland is not  diffusely enlarged or tender. Left salivary gland is not diffusely enlarged or tender.     Right Ear: Hearing normal.     Left Ear: Hearing normal.     Nose: Nose normal.     Mouth/Throat:     Lips: Pink.     Mouth: Mucous membranes are moist. No lacerations or oral lesions.     Dentition: Abnormal dentition. Does not have dentures. Dental tenderness, gingival swelling and dental caries present.     Tongue: No lesions. Tongue does not deviate from midline.     Palate: No mass.     Pharynx: Oropharynx is clear. Uvula midline. No oropharyngeal exudate or posterior oropharyngeal erythema.     Tonsils: No tonsillar exudate or tonsillar abscesses.     Comments: Poor dentician  R lower wisdom tooth is impacted. Gingival swelling overlying tooth. No trismus, drooling, sore throat, voice changes, swelling underneath the tongue, swelling underneath the jaw, neck stiffness. Eyes:     Extraocular Movements: Extraocular movements intact.     Pupils: Pupils are equal, round, and reactive to light.  Pulmonary:     Effort: Pulmonary effort is normal.  Neurological:     General: No  focal deficit present.     Mental Status: She is alert and oriented to person, place, and time.  Psychiatric:        Mood and Affect: Mood normal.        Behavior: Behavior normal.        Thought Content: Thought content normal.        Judgment: Judgment normal.      UC Treatments / Results  Labs (all labs ordered are listed, but only abnormal results are displayed) Labs Reviewed - No data to display  EKG   Radiology No results found.  Procedures Procedures (including critical care time)  Medications Ordered in UC Medications - No data to display  Initial Impression / Assessment and Plan / UC Course  I have reviewed the triage vital signs and the nursing notes.  Pertinent labs & imaging results that were available during my care of the patient were reviewed by me and considered in my medical decision making (see chart for details).     This patient is a very pleasant 26 y.o. year old female presenting with dental infection. Afebrile, nontachy. LMP 03/06/22, States she is not pregnant or breastfeeding. Penicillin allergic. Clindamycin sent. F/u with dentist. ED return precautions discussed. Patient verbalizes understanding and agreement.    Final Clinical Impressions(s) / UC Diagnoses   Final diagnoses:  Dental infection  Penicillin allergy     Discharge Instructions      -Clindamycin every 6 hours x7 days -For pain -You can take Tylenol up to 1000 mg 3 times daily, and ibuprofen up to 600 mg 3 times daily with food.  You can take these together, or alternate every 3-4 hours. -Follow-up with dentist at their earliest convenienc e   ED Prescriptions     Medication Sig Dispense Auth. Provider   clindamycin (CLEOCIN) 300 MG capsule Take 1 capsule (300 mg total) by mouth every 6 (six) hours for 7 days. 28 capsule Rhys Martini, PA-C      PDMP not reviewed this encounter.   Rhys Martini, PA-C 03/12/22 1836

## 2022-03-12 NOTE — Discharge Instructions (Addendum)
-  Clindamycin every 6 hours x7 days -For pain -You can take Tylenol up to 1000 mg 3 times daily, and ibuprofen up to 600 mg 3 times daily with food.  You can take these together, or alternate every 3-4 hours. -Follow-up with dentist at their earliest convenienc e

## 2022-03-12 NOTE — ED Triage Notes (Signed)
Patient presents to Urgent Care with complaints of dental pain on right lower side with swelling since 3 days ago. Patient reports she has not tried any otc medications, attempting to find dentist now. Reports she has not been to dentist since she was a child.

## 2022-09-24 ENCOUNTER — Encounter (HOSPITAL_COMMUNITY): Payer: Self-pay

## 2022-09-24 ENCOUNTER — Other Ambulatory Visit: Payer: Self-pay

## 2022-09-24 ENCOUNTER — Emergency Department (HOSPITAL_COMMUNITY): Payer: No Typology Code available for payment source

## 2022-09-24 ENCOUNTER — Ambulatory Visit (HOSPITAL_COMMUNITY): Admission: EM | Admit: 2022-09-24 | Discharge: 2022-09-24 | Discharge status: Against Medical Advice | Payer: Self-pay

## 2022-09-24 ENCOUNTER — Emergency Department (HOSPITAL_COMMUNITY)
Admission: EM | Admit: 2022-09-24 | Discharge: 2022-09-24 | Disposition: A | Discharge status: Home / Self Care | Payer: No Typology Code available for payment source | Attending: Emergency Medicine | Admitting: Emergency Medicine

## 2022-09-24 DIAGNOSIS — Y92481 Parking lot as the place of occurrence of the external cause: Secondary | ICD-10-CM | POA: Diagnosis not present

## 2022-09-24 DIAGNOSIS — M545 Low back pain, unspecified: Secondary | ICD-10-CM | POA: Diagnosis present

## 2022-09-24 DIAGNOSIS — M62830 Muscle spasm of back: Secondary | ICD-10-CM

## 2022-09-24 LAB — PREGNANCY, URINE: Preg Test, Ur: NEGATIVE

## 2022-09-24 MED ORDER — DEXAMETHASONE SODIUM PHOSPHATE 10 MG/ML IJ SOLN
10.0000 mg | Freq: Once | INTRAMUSCULAR | Status: AC
  Administered 2022-09-24: 10 mg via INTRAMUSCULAR
  Filled 2022-09-24: qty 1

## 2022-09-24 MED ORDER — PREDNISONE 20 MG PO TABS: 40.0000 mg | ORAL_TABLET | Freq: Every day | ORAL | 0 refills | Status: AC

## 2022-09-24 MED ORDER — LIDOCAINE 5 % EX PTCH: 1.0000 | MEDICATED_PATCH | CUTANEOUS | 0 refills | Status: AC

## 2022-09-24 MED ORDER — METHOCARBAMOL 500 MG PO TABS: 500.0000 mg | ORAL_TABLET | Freq: Two times a day (BID) | ORAL | 0 refills | Status: AC

## 2022-09-24 MED ORDER — LIDOCAINE 5 % EX PTCH
1.0000 | MEDICATED_PATCH | Freq: Once | CUTANEOUS | Status: DC
  Administered 2022-09-24: 1 via TRANSDERMAL
  Filled 2022-09-24: qty 1

## 2022-09-24 MED ORDER — KETOROLAC TROMETHAMINE 15 MG/ML IJ SOLN
15.0000 mg | Freq: Once | INTRAMUSCULAR | Status: AC
  Administered 2022-09-24: 15 mg via INTRAMUSCULAR
  Filled 2022-09-24: qty 1

## 2022-09-24 NOTE — ED Notes (Signed)
Informed by the front desk staff that Patient left without being seen.
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
## 2022-09-24 NOTE — Discharge Instructions (Addendum)
It was a pleasure taking care of you today!   Your imaging in the ED was negative for fracture or dislocations. You are prescribed Robaxin (muscle relaxer). Do not drive or operate heavy machinery while taking the muscle relaxer as it can make you sleepy/drowsy. You may take over the counter 600 mg Ibuprofen every 6 hours and alternate with 500 mg Tylenol every 6 hours as needed for pain for no more than 7 days. You may apply ice or heat to affected area for up to 15 minutes at a time. You will be sent with a lidoderm patch, use as directed. Ensure to place a barrier between your skin and the ice/heat.  You will be sent a prescription for prednisone, take as directed.  Return to the Emergency Department if you are experiencing increasing/worsening symptoms.

## 2023-10-12 ENCOUNTER — Other Ambulatory Visit: Payer: Self-pay

## 2023-10-12 ENCOUNTER — Ambulatory Visit (HOSPITAL_COMMUNITY)
Admission: EM | Admit: 2023-10-12 | Discharge: 2023-10-12 | Disposition: A | Discharge status: Home / Self Care | Payer: 59 | Attending: Physician Assistant | Admitting: Physician Assistant

## 2023-10-12 ENCOUNTER — Encounter (HOSPITAL_COMMUNITY): Payer: Self-pay | Admitting: Emergency Medicine

## 2023-10-12 DIAGNOSIS — J209 Acute bronchitis, unspecified: Secondary | ICD-10-CM

## 2023-10-12 DIAGNOSIS — J111 Influenza due to unidentified influenza virus with other respiratory manifestations: Secondary | ICD-10-CM | POA: Diagnosis not present

## 2023-10-12 LAB — POC COVID19/FLU A&B COMBO
Covid Antigen, POC: NEGATIVE
Influenza A Antigen, POC: NEGATIVE
Influenza B Antigen, POC: NEGATIVE

## 2023-10-12 MED ORDER — PREDNISONE 20 MG PO TABS: ORAL_TABLET | ORAL | 0 refills | Status: AC

## 2023-10-12 NOTE — ED Triage Notes (Signed)
 Cough, congestion , chills.  Congestion is felt in head and chest. Reports moving her eyes hurts as well.  Patient reports symptoms present for 2 days.    Has taken theraflu.

## 2023-10-12 NOTE — ED Provider Notes (Signed)
 [facility]    CSN: [id number] Arrival date & time: 10/12/23  1204      History   Chief Complaint Chief Complaint  Patient presents with   Cough    HPI Kerly Rigsbee is a 28 y.o. female.   HPI  She reports she has been having diaphoresis, chills, congestion, SOB, coughing, burning in the chest with coughing She states moving her eyes sometimes hurts as well She states several people at work have been sick recently as well  She has been taking theraflu for her symptoms - last dose was this AM at 4    History reviewed. No pertinent past medical history.  There are no active problems to display for this patient.   History reviewed. No pertinent surgical history.  OB History   No obstetric history on file.      Home Medications    Prior to Admission medications   Medication Sig Start Date End Date Taking? Authorizing Provider  predniSONE (DELTASONE) 20 MG tablet Take 60mg  PO daily x 2 days, then40mg  PO daily x 2 days, then 20mg  PO daily x 3 days 10/12/23  Yes Arrietty Dercole E, PA-C  ibuprofen (ADVIL) 200 MG tablet Take 800 mg by mouth every 6 (six) hours as needed for moderate pain.    [provider]  lidocaine (LIDODERM) 5 % Place 1 patch onto the skin daily. Remove & Discard patch within 12 hours or as directed by MD Patient not taking: Reported on 10/12/2023 09/24/22   Blue, Soijett A, PA-C  methocarbamol (ROBAXIN) 500 MG tablet Take 1 tablet (500 mg total) by mouth 2 (two) times daily. Patient not taking: Reported on 10/12/2023 09/24/22   Blue, Soijett A, PA-C    Family History Family History  Problem Relation Age of Onset   Healthy Mother    Healthy Father     Social History Social History   Tobacco Use   Smoking status: Never   Smokeless tobacco: Never  Vaping Use   Vaping status: Never Used  Substance Use Topics   Alcohol use: No   Drug use: No     Allergies   Penicillins   Review of Systems Review of Systems   Constitutional:  Positive for chills, diaphoresis and fatigue.  HENT:  Positive for congestion, ear pain, postnasal drip, rhinorrhea and sore throat.   Eyes:  Positive for pain.  Respiratory:  Positive for cough (productive) and shortness of breath.   Gastrointestinal:  Positive for nausea. Negative for diarrhea and vomiting.  Musculoskeletal:  Positive for myalgias.  Neurological:  Positive for light-headedness.     Physical Exam Triage Vital Signs ED Triage Vitals  Encounter Vitals Group     BP 10/12/23 1349 127/87     Systolic BP Percentile --      Diastolic BP Percentile --      Pulse Rate 10/12/23 1349 90     Resp 10/12/23 1349 20     Temp 10/12/23 1349 99.1 F (37.3 C)     Temp Source 10/12/23 1349 Oral     SpO2 10/12/23 1349 96 %     Weight --      Height --      Head Circumference --      Peak Flow --      Pain Score 10/12/23 1346 10     Pain Loc --      Pain Education --      Exclude from Growth Chart --    No  data found.  Updated Vital Signs BP 127/87 (BP Location: Left Arm) Comment (BP Location): regular cuff, forearm  Pulse 90   Temp 99.1 F (37.3 C) (Oral)   Resp 20   LMP 09/20/2023   SpO2 96%   Visual Acuity Right Eye Distance:   Left Eye Distance:   Bilateral Distance:    Right Eye Near:   Left Eye Near:    Bilateral Near:     Physical Exam Vitals reviewed.  Constitutional:      General: She is awake.     Appearance: Normal appearance. She is well-developed and well-groomed.  HENT:     Head: Normocephalic and atraumatic.     Right Ear: Hearing and ear canal normal. A middle ear effusion is present.     Left Ear: Hearing and ear canal normal. A middle ear effusion is present.     Mouth/Throat:     Lips: Pink.     Mouth: Mucous membranes are moist.     Pharynx: Oropharynx is clear. Uvula midline. No pharyngeal swelling, oropharyngeal exudate, posterior oropharyngeal erythema, uvula swelling or postnasal drip.  Cardiovascular:     Rate  and Rhythm: Normal rate and regular rhythm.     Pulses: Normal pulses.          Radial pulses are 2+ on the right side and 2+ on the left side.     Heart sounds: Normal heart sounds. No murmur heard.    No friction rub. No gallop.  Pulmonary:     Effort: Pulmonary effort is normal.     Breath sounds: Decreased air movement present. Decreased breath sounds present. No wheezing, rhonchi or rales.  Musculoskeletal:     Cervical back: Normal range of motion and neck supple.  Lymphadenopathy:     Head:     Right side of head: No submental, submandibular or preauricular adenopathy.     Left side of head: No submental, submandibular or preauricular adenopathy.     Cervical:     Right cervical: No superficial cervical adenopathy.    Left cervical: No superficial cervical adenopathy.     Upper Body:     Right upper body: No supraclavicular adenopathy.     Left upper body: No supraclavicular adenopathy.  Neurological:     Mental Status: She is alert.  Psychiatric:        Attention and Perception: Attention normal.        Mood and Affect: Mood normal.        Speech: Speech normal.        Behavior: Behavior normal. Behavior is cooperative.      UC Treatments / Results  Labs (all labs ordered are listed, but only abnormal results are displayed) Labs Reviewed  POC COVID19/FLU A&B COMBO - Normal    EKG   Radiology No results found.  Procedures Procedures (including critical care time)  Medications Ordered in UC Medications - No data to display  Initial Impression / Assessment and Plan / UC Course  I have reviewed the triage vital signs and the nursing notes.  Pertinent labs & imaging results that were available during my care of the patient were reviewed by me and considered in my medical decision making (see chart for details).      Final Clinical Impressions(s) / UC Diagnoses   Final diagnoses:  Influenza-like illness  Acute bronchitis, unspecified organism   Acute,  new concern Patient reports that she has had flulike symptoms since Sunday.  She reports that  several coworkers have had similar symptoms lately.  Rapid flu and COVID testing was negative today.  Physical exam was notable for mildly decreased air movement and some decreased breath sounds in the lower lung fields.  Vitals and oxygen saturation are overall reassuring at this time.  I am less suspicious for pneumonia and suspect that this is likely secondary to bronchitis.  Will send in prednisone taper to assist with pulmonary inflammation and coughing.  Reviewed that she can take over-the-counter medications as needed for further symptomatic management.  ED and return precautions reviewed and provided in AVS.  Follow-up as needed for progressing or persistent symptoms    Discharge Instructions      Your testing was negative for flu and COVID.  At this time I suspect you likely have an upper respiratory infection likely caused by a virus as well as a mild case of bronchitis.  Symptoms can usually last anywhere from 3 to 10 days.  Sometimes people will continue to have a dry cough for a few weeks after the resolution of the or other symptoms. You can continue to use over-the-counter medications as needed for symptomatic management. I have sent in a script for Prednisone taper to be taken in the morning with breakfast per the instructions on the container Remember that steroids can cause sleeplessness, irritability, increased hunger and elevated glucose levels so be mindful of these side effects. They should lessen as you progress to the lower doses of the taper. The prednisone should help with your bronchitis and shortness of breath.  This helps with inflammation and also helps to open up the lung passages to ease your breathing. If it anytime you start to have increased shortness of breath, fevers that are not responding to medications, chest pain, passing out, dehydration please go to the emergency room  for prompt evaluation and management.     ED Prescriptions     Medication Sig Dispense Auth. Provider   predniSONE (DELTASONE) 20 MG tablet Take 60mg  PO daily x 2 days, then40mg  PO daily x 2 days, then 20mg  PO daily x 3 days 13 tablet Jacorian Golaszewski E, PA-C      PDMP not reviewed this encounter.   Providence Crosby, PA-C 10/12/23 0981

## 2023-10-12 NOTE — Discharge Instructions (Addendum)
 Your testing was negative for flu and COVID.  At this time I suspect you likely have an upper respiratory infection likely caused by a virus as well as a mild case of bronchitis.  Symptoms can usually last anywhere from 3 to 10 days.  Sometimes people will continue to have a dry cough for a few weeks after the resolution of the or other symptoms. You can continue to use over-the-counter medications as needed for symptomatic management. I have sent in a script for Prednisone taper to be taken in the morning with breakfast per the instructions on the container Remember that steroids can cause sleeplessness, irritability, increased hunger and elevated glucose levels so be mindful of these side effects. They should lessen as you progress to the lower doses of the taper. The prednisone should help with your bronchitis and shortness of breath.  This helps with inflammation and also helps to open up the lung passages to ease your breathing. If it anytime you start to have increased shortness of breath, fevers that are not responding to medications, chest pain, passing out, dehydration please go to the emergency room for prompt evaluation and management.

## 2024-01-07 ENCOUNTER — Encounter (HOSPITAL_COMMUNITY): Payer: Self-pay

## 2024-01-07 ENCOUNTER — Other Ambulatory Visit: Payer: Self-pay

## 2024-01-07 ENCOUNTER — Emergency Department (HOSPITAL_COMMUNITY)
Admission: EM | Admit: 2024-01-07 | Discharge: 2024-01-07 | Disposition: A | Discharge status: Home / Self Care | Payer: Self-pay | Attending: Emergency Medicine | Admitting: Emergency Medicine

## 2024-01-07 DIAGNOSIS — N760 Acute vaginitis: Secondary | ICD-10-CM | POA: Insufficient documentation

## 2024-01-07 DIAGNOSIS — B9689 Other specified bacterial agents as the cause of diseases classified elsewhere: Secondary | ICD-10-CM | POA: Insufficient documentation

## 2024-01-07 DIAGNOSIS — N898 Other specified noninflammatory disorders of vagina: Secondary | ICD-10-CM

## 2024-01-07 LAB — URINALYSIS, ROUTINE W REFLEX MICROSCOPIC
Bacteria, UA: NONE SEEN
Bilirubin Urine: NEGATIVE
Glucose, UA: NEGATIVE mg/dL
Hgb urine dipstick: NEGATIVE
Ketones, ur: NEGATIVE mg/dL
Nitrite: NEGATIVE
Protein, ur: NEGATIVE mg/dL
Specific Gravity, Urine: 1.027 (ref 1.005–1.030)
pH: 5 (ref 5.0–8.0)

## 2024-01-07 LAB — WET PREP, GENITAL
Sperm: NONE SEEN
Trich, Wet Prep: NONE SEEN
WBC, Wet Prep HPF POC: <10 (ref <10)
Yeast Wet Prep HPF POC: NONE SEEN

## 2024-01-07 LAB — GC/CHLAMYDIA PROBE AMP (~~LOC~~) NOT AT ARMC
Chlamydia: NEGATIVE
Comment: NEGATIVE
Comment: NORMAL
Neisseria Gonorrhea: NEGATIVE

## 2024-01-07 LAB — PREGNANCY, URINE: Preg Test, Ur: NEGATIVE

## 2024-01-07 LAB — HIV ANTIBODY (ROUTINE TESTING W REFLEX): HIV Screen 4th Generation wRfx: NONREACTIVE

## 2024-01-07 LAB — RPR: RPR Ser Ql: NONREACTIVE

## 2024-01-07 MED ORDER — METRONIDAZOLE 500 MG PO TABS
2000.0000 mg | ORAL_TABLET | Freq: Once | ORAL | Status: AC
  Administered 2024-01-07: 2000 mg via ORAL
  Filled 2024-01-07: qty 4

## 2024-01-07 MED ORDER — AZITHROMYCIN 250 MG PO TABS
1000.0000 mg | ORAL_TABLET | Freq: Once | ORAL | Status: AC
  Administered 2024-01-07: 1000 mg via ORAL
  Filled 2024-01-07: qty 4

## 2024-01-07 MED ORDER — METRONIDAZOLE 500 MG PO TABS: 500.0000 mg | ORAL_TABLET | Freq: Two times a day (BID) | ORAL | 0 refills | Status: AC

## 2024-01-07 MED ORDER — CEFTRIAXONE SODIUM 1 G IJ SOLR
1.0000 g | Freq: Once | INTRAMUSCULAR | Status: AC
  Administered 2024-01-07: 1 g via INTRAMUSCULAR
  Filled 2024-01-07: qty 10

## 2024-01-07 MED ORDER — LIDOCAINE HCL (PF) 1 % IJ SOLN
INTRAMUSCULAR | Status: AC
  Filled 2024-01-07: qty 30

## 2024-01-07 NOTE — ED Provider Notes (Signed)
 [facility] Provider Note   CSN: [id number] Arrival date & time: 01/07/24  0204     History  Chief Complaint  Patient presents with   Abdominal Pain   Vaginal Discharge    Kristin [name] is a 28 y.o. female.  28 yo F with a chief complaints of vaginal discharge.  Patient had unprotected sexual encounter and she is worried that she perhaps is called a sexually transmitted disease.  Feels a little bit uncomfortable down there.  Has been nauseated at times.  Has a history of BV in the past and thinks this feels somewhat similar.   Abdominal Pain Associated symptoms: vaginal discharge   Vaginal Discharge Associated symptoms: abdominal pain        Home Medications Prior to Admission medications   Medication Sig Start Date End Date Taking? Authorizing Provider  metroNIDAZOLE  (FLAGYL ) 500 MG tablet Take 1 tablet (500 mg total) by mouth 2 (two) times daily. 01/07/24  Yes Albertus Hughs, DO  amoxicillin (AMOXIL) 500 MG tablet Take 500 mg by mouth 2 (two) times daily. Patient not taking: Reported on 01/07/2024 09/22/23   [provider]  lidocaine  (LIDODERM ) 5 % Place 1 patch onto the skin daily. Remove & Discard patch within 12 hours or as directed by MD Patient not taking: Reported on 10/12/2023 09/24/22   Blue, Soijett A, PA-C  methocarbamol  (ROBAXIN ) 500 MG tablet Take 1 tablet (500 mg total) by mouth 2 (two) times daily. Patient not taking: Reported on 10/12/2023 09/24/22   Blue, Soijett A, PA-C  predniSONE  (DELTASONE ) 20 MG tablet Take 60mg  PO daily x 2 days, then40mg  PO daily x 2 days, then 20mg  PO daily x 3 days Patient not taking: Reported on 01/07/2024 10/12/23   Mecum, Erin E, PA-C      Allergies    Penicillins    Review of Systems   Review of Systems  Gastrointestinal:  Positive for abdominal pain.  Genitourinary:  Positive for vaginal discharge.    Physical Exam Updated Vital Signs BP 134/86   Pulse 67   Temp 97.8 F  (36.6 C) (Oral)   Resp 18   Ht 5\' 7"  (1.702 m)   Wt 113.4 kg   LMP 12/23/2023 (Approximate)   SpO2 100%   BMI 39.16 kg/m  Physical Exam Vitals and nursing note reviewed.  Constitutional:      General: She is not in acute distress.    Appearance: She is well-developed. She is not diaphoretic.  HENT:     Head: Normocephalic and atraumatic.  Eyes:     Pupils: Pupils are equal, round, and reactive to light.  Cardiovascular:     Rate and Rhythm: Normal rate and regular rhythm.     Heart sounds: No murmur heard.    No friction rub. No gallop.  Pulmonary:     Effort: Pulmonary effort is normal.     Breath sounds: No wheezing or rales.  Abdominal:     General: There is no distension.     Palpations: Abdomen is soft.     Tenderness: There is no abdominal tenderness.  Musculoskeletal:        General: No tenderness.     Cervical back: Normal range of motion and neck supple.  Skin:    General: Skin is warm and dry.  Neurological:     Mental Status: She is alert and oriented to person, place, and time.  Psychiatric:        Behavior: Behavior  normal.     ED Results / Procedures / Treatments   Labs (all labs ordered are listed, but only abnormal results are displayed) Labs Reviewed  WET PREP, GENITAL - Abnormal; Notable for the following components:      Result Value   Clue Cells Wet Prep HPF POC PRESENT (*)    All other components within normal limits  URINALYSIS, ROUTINE W REFLEX MICROSCOPIC - Abnormal; Notable for the following components:   APPearance HAZY (*)    Leukocytes,Ua MODERATE (*)    All other components within normal limits  PREGNANCY, URINE  RPR  HIV ANTIBODY (ROUTINE TESTING W REFLEX)  GC/CHLAMYDIA PROBE AMP ([place]) NOT AT [facility]    EKG None  Radiology No results found.  Procedures Procedures    Medications Ordered in ED Medications  cefTRIAXone  (ROCEPHIN ) injection 1 g (1 g Intramuscular Given 01/07/24 0316)  azithromycin  (ZITHROMAX ) tablet  1,000 mg (1,000 mg Oral Given 01/07/24 0318)  metroNIDAZOLE  (FLAGYL ) tablet 2,000 mg (2,000 mg Oral Given 01/07/24 0317)  lidocaine  (PF) (XYLOCAINE ) 1 % injection (  Given 01/07/24 0318)    ED Course/ Medical Decision Making/ A&P                                 Medical Decision Making Amount and/or Complexity of Data Reviewed Labs: ordered.  Risk Prescription drug management.   28 yo F with a chief complaints of vaginal discharge.  This been going on for about a week.  She is concerned that she perhaps has BV.  She has no significant abdominal pain on exam.  Will have her self swab here.  Will treat presumptively for gonorrhea and chlamydia.  Wet prep does show clue cells consistent with BV.  UA negative for infection.  Pregnancy test negative.  Will discharge home.  PCP follow-up.  4:34 AM:  I have discussed the diagnosis/risks/treatment options with the patient.  Evaluation and diagnostic testing in the emergency department does not suggest an emergent condition requiring admission or immediate intervention beyond what has been performed at this time.  They will follow up with PCP. We also discussed returning to the ED immediately if new or worsening sx occur. We discussed the sx which are most concerning (e.g., sudden worsening pain, fever, inability to tolerate by mouth) that necessitate immediate return. Medications administered to the patient during their visit and any new prescriptions provided to the patient are listed below.  Medications given during this visit Medications  cefTRIAXone  (ROCEPHIN ) injection 1 g (1 g Intramuscular Given 01/07/24 0316)  azithromycin  (ZITHROMAX ) tablet 1,000 mg (1,000 mg Oral Given 01/07/24 0318)  metroNIDAZOLE  (FLAGYL ) tablet 2,000 mg (2,000 mg Oral Given 01/07/24 0317)  lidocaine  (PF) (XYLOCAINE ) 1 % injection (  Given 01/07/24 0318)     The patient appears reasonably screen and/or stabilized for discharge and I doubt any other medical condition or  other [facility] requiring further screening, evaluation, or treatment in the ED at this time prior to discharge.          Final Clinical Impression(s) / ED Diagnoses Final diagnoses:  Bacterial vaginosis  Vaginal discharge    Rx / DC Orders ED Discharge Orders          Ordered    metroNIDAZOLE  (FLAGYL ) 500 MG tablet  2 times daily        01/07/24 0432              Inga Manges,  Genella Kendall, DO 01/07/24 [phone]

## 2024-01-07 NOTE — Discharge Instructions (Addendum)
 It does look that you have BV.  I have started you on an antibiotic that should help you with.  Please follow-up with your family doctor in the office.  Sometimes if you have recurrent BV you would benefit from seeing an OB/GYN.  There is information for women's clinic in this paperwork if you want to be seen in that office.

## 2024-01-07 NOTE — ED Triage Notes (Signed)
 Pt ambulatory to triage with c/o abdominal pain x 1 week. Pt reports she has been having unprotected sex and wants to get checked out. Pt reports abnormal discharge that has an odor. Pt reports nausea. Pt reports last period was at the beginning of this month.
